# Patient Record
Sex: Male | Born: 1970 | Race: Black or African American | Hispanic: No | Marital: Single | Smoking: Never smoker
Health system: Southern US, Community
[De-identification: ages and names within clinical notes are randomized; demographics above are authoritative.]

## PROBLEM LIST (undated history)

## (undated) DIAGNOSIS — T7840XA Allergy, unspecified, initial encounter: Secondary | ICD-10-CM

## (undated) DIAGNOSIS — I10 Essential (primary) hypertension: Secondary | ICD-10-CM

## (undated) DIAGNOSIS — J45909 Unspecified asthma, uncomplicated: Secondary | ICD-10-CM

## (undated) HISTORY — PX: VASECTOMY: SHX75

## (undated) HISTORY — PX: TONSILLECTOMY: SUR1361

## (undated) HISTORY — DX: Allergy, unspecified, initial encounter: T78.40XA

---

## 2016-01-30 ENCOUNTER — Observation Stay
Admission: EM | Admit: 2016-01-30 | Discharge: 2016-01-31 | Disposition: A | Discharge status: Home / Self Care | Payer: Managed Care, Other (non HMO) | Attending: Internal Medicine | Admitting: Internal Medicine

## 2016-01-30 ENCOUNTER — Encounter: Payer: Self-pay | Admitting: Emergency Medicine

## 2016-01-30 DIAGNOSIS — F1010 Alcohol abuse, uncomplicated: Secondary | ICD-10-CM

## 2016-01-30 DIAGNOSIS — F10129 Alcohol abuse with intoxication, unspecified: Secondary | ICD-10-CM | POA: Diagnosis not present

## 2016-01-30 DIAGNOSIS — R739 Hyperglycemia, unspecified: Secondary | ICD-10-CM | POA: Diagnosis not present

## 2016-01-30 DIAGNOSIS — I959 Hypotension, unspecified: Secondary | ICD-10-CM

## 2016-01-30 DIAGNOSIS — E876 Hypokalemia: Secondary | ICD-10-CM

## 2016-01-30 DIAGNOSIS — Z79899 Other long term (current) drug therapy: Secondary | ICD-10-CM | POA: Insufficient documentation

## 2016-01-30 DIAGNOSIS — N179 Acute kidney failure, unspecified: Secondary | ICD-10-CM | POA: Diagnosis not present

## 2016-01-30 DIAGNOSIS — R55 Syncope and collapse: Secondary | ICD-10-CM | POA: Diagnosis not present

## 2016-01-30 DIAGNOSIS — J45909 Unspecified asthma, uncomplicated: Secondary | ICD-10-CM | POA: Diagnosis not present

## 2016-01-30 DIAGNOSIS — E860 Dehydration: Secondary | ICD-10-CM

## 2016-01-30 DIAGNOSIS — Y908 Blood alcohol level of 240 mg/100 ml or more: Secondary | ICD-10-CM | POA: Diagnosis not present

## 2016-01-30 DIAGNOSIS — R404 Transient alteration of awareness: Secondary | ICD-10-CM

## 2016-01-30 DIAGNOSIS — I10 Essential (primary) hypertension: Secondary | ICD-10-CM | POA: Insufficient documentation

## 2016-01-30 DIAGNOSIS — F10929 Alcohol use, unspecified with intoxication, unspecified: Secondary | ICD-10-CM

## 2016-01-30 DIAGNOSIS — F1210 Cannabis abuse, uncomplicated: Secondary | ICD-10-CM

## 2016-01-30 DIAGNOSIS — I6522 Occlusion and stenosis of left carotid artery: Secondary | ICD-10-CM | POA: Diagnosis not present

## 2016-01-30 DIAGNOSIS — R4189 Other symptoms and signs involving cognitive functions and awareness: Secondary | ICD-10-CM

## 2016-01-30 DIAGNOSIS — I6529 Occlusion and stenosis of unspecified carotid artery: Secondary | ICD-10-CM

## 2016-01-30 HISTORY — DX: Essential (primary) hypertension: I10

## 2016-01-30 HISTORY — DX: Unspecified asthma, uncomplicated: J45.909

## 2016-01-30 LAB — URINALYSIS COMPLETE WITH MICROSCOPIC (ARMC ONLY)
BILIRUBIN URINE: NEGATIVE
Glucose, UA: 50 mg/dL — AB
HGB URINE DIPSTICK: NEGATIVE
KETONES UR: NEGATIVE mg/dL
LEUKOCYTES UA: NEGATIVE
Nitrite: NEGATIVE
PH: 6 (ref 5.0–8.0)
PROTEIN: NEGATIVE mg/dL
RBC / HPF: NONE SEEN RBC/hpf (ref 0–5)
Specific Gravity, Urine: 1.009 (ref 1.005–1.030)
Squamous Epithelial / LPF: NONE SEEN

## 2016-01-30 LAB — BASIC METABOLIC PANEL
ANION GAP: 12 (ref 5–15)
BUN: 26 mg/dL — ABNORMAL HIGH (ref 6–20)
CALCIUM: 8.7 mg/dL — AB (ref 8.9–10.3)
CHLORIDE: 99 mmol/L — AB (ref 101–111)
CO2: 24 mmol/L (ref 22–32)
CREATININE: 1.97 mg/dL — AB (ref 0.61–1.24)
GFR calc non Af Amer: 39 mL/min — ABNORMAL LOW (ref >60)
GFR, EST AFRICAN AMERICAN: 45 mL/min — AB (ref >60)
Glucose, Bld: 172 mg/dL — ABNORMAL HIGH (ref 65–99)
Potassium: 3.2 mmol/L — ABNORMAL LOW (ref 3.5–5.1)
SODIUM: 135 mmol/L (ref 135–145)

## 2016-01-30 LAB — CBC
HCT: 42.9 % (ref 40.0–52.0)
HEMOGLOBIN: 14.2 g/dL (ref 13.0–18.0)
MCH: 29.3 pg (ref 26.0–34.0)
MCHC: 33.1 g/dL (ref 32.0–36.0)
MCV: 88.4 fL (ref 80.0–100.0)
PLATELETS: 280 10*3/uL (ref 150–440)
RBC: 4.85 MIL/uL (ref 4.40–5.90)
RDW: 13.7 % (ref 11.5–14.5)
WBC: 11.5 10*3/uL — AB (ref 3.8–10.6)

## 2016-01-30 MED ORDER — SODIUM CHLORIDE 0.9 % IV BOLUS (SEPSIS)
1000.0000 mL | Freq: Once | INTRAVENOUS | Status: AC
  Administered 2016-01-30: 1000 mL via INTRAVENOUS

## 2016-01-30 MED ORDER — SODIUM CHLORIDE 0.9 % IV BOLUS (SEPSIS)
500.0000 mL | Freq: Once | INTRAVENOUS | Status: AC
  Administered 2016-01-30: 500 mL via INTRAVENOUS

## 2016-01-30 NOTE — ED Provider Notes (Signed)
[facility] Emergency Department Provider Note  ____________________________________________   First MD Initiated Contact with Patient 01/30/16 2208     (approximate)  I have reviewed the triage vital signs and the nursing notes.   HISTORY  Chief Complaint Loss of Consciousness    HPI Abdulwahab Demelo is a 45 y.o. male with a history of hypertension on lisinopril who presents for evaluation after an episode of syncope.  He reports that he was at a family reunion tonight and that all of a sudden he just passed out.  I witness reports state that he may been unconscious for as much as 10 minutes but this is impossible to verify.  He was hypotensive in the field and continued to be hypotensive upon arrival to the emergency department with a systolic blood pressure in the 80s.  He reports that he has been eating and drinking normally lately and has felt in his normal state of health.  He denies fever/chills, chest pain, shortness of breath, nausea, vomiting, diarrhea, dysuria.  He does report that his lisinopril was recently increased from 10-20 mg daily but he states that typically any time he checked his blood pressure it is usually quite elevated and that the low blood pressure today is very unusual.  He reports that he has had a couple of beers over the course of the day today but he does not appear clinically intoxicated at this time.  He also reports that he smokes marijuana earlier in the day.  He currently is in no pain.  The onset of syncope was acute in the symptoms are severe but he appears to be back at his baseline at this time.     Past Medical History:  Diagnosis Date  . Asthma   . Hypertension     There are no active problems to display for this patient.   History reviewed. No pertinent surgical history.  Prior to Admission medications   Medication Sig Start Date End Date Taking? Authorizing Provider  lisinopril-hydrochlorothiazide  (PRINZIDE,ZESTORETIC) 20-25 MG tablet Take 1 tablet by mouth daily. 12/14/15  Yes Historical Provider, MD  PROAIR HFA 108 (90 Base) MCG/ACT inhaler Inhale 2 puffs into the lungs every 4 (four) hours as needed. 12/18/15  Yes Historical Provider, MD  VIAGRA 100 MG tablet Take 1 tablet by mouth as needed. 12/04/15  Yes Historical Provider, MD    Allergies Review of patient's allergies indicates no known allergies.  History reviewed. No pertinent family history.  Social History Social History  Substance Use Topics  . Smoking status: Never Smoker  . Smokeless tobacco: Never Used  . Alcohol use Yes    Review of Systems Constitutional: No fever/chills Eyes: No visual changes. ENT: No sore throat. Cardiovascular: Denies chest pain.  Syncope and collapse. Respiratory: Denies shortness of breath. Gastrointestinal: No abdominal pain.  No nausea, no vomiting.  No diarrhea.  No constipation. Genitourinary: Negative for dysuria. Musculoskeletal: Negative for back pain. Skin: Negative for rash. Neurological: Negative for headaches, focal weakness or numbness.    10-point ROS otherwise negative.  ____________________________________________   PHYSICAL EXAM:  VITAL SIGNS: ED Triage Vitals  Enc Vitals Group     BP 01/30/16 2200 98/60     Pulse Rate 01/30/16 2200 93     Resp 01/30/16 2200 12     Temp 01/30/16 2200 97.6 F (36.4 C)     Temp Source 01/30/16 2200 Oral     SpO2 01/30/16 2200 100 %     Weight  01/30/16 2201 160 lb (72.6 kg)     Height 01/30/16 2201 5\' 9"  (1.753 m)     Head Circumference --      Peak Flow --      Pain Score 01/30/16 2201 0     Pain Loc --      Pain Edu? --      Excl. in GC? --     Constitutional: Alert and oriented. Well appearing and in no acute distress. Eyes: Conjunctivae are normal. PERRL. EOMI. Head: Atraumatic. Nose: No congestion/rhinnorhea. Mouth/Throat: Mucous membranes are moist.  Oropharynx non-erythematous. Neck: No stridor.  No meningeal  signs.  No cervical spine tenderness to palpation. Cardiovascular: Normal rate, regular rhythm. Good peripheral circulation. Grossly normal heart sounds. Respiratory: Normal respiratory effort.  No retractions. Lungs CTAB. Gastrointestinal: Soft and nontender. No distention.  Musculoskeletal: No lower extremity tenderness nor edema. No gross deformities of extremities. Neurologic:  Normal speech and language. No gross focal neurologic deficits are appreciated.  Skin:  Skin is warm, dry and intact. No rash noted. Psychiatric: Mood and affect are normal. Speech and behavior are normal.  ____________________________________________   LABS (all labs ordered are listed, but only abnormal results are displayed)  Labs Reviewed  BASIC METABOLIC PANEL - Abnormal; Notable for the following:       Result Value   Potassium 3.2 (*)    Chloride 99 (*)    Glucose, Bld 172 (*)    BUN 26 (*)    Creatinine, Ser 1.97 (*)    Calcium 8.7 (*)    GFR calc non Af Amer 39 (*)    GFR calc Af Amer 45 (*)    All other components within normal limits  CBC - Abnormal; Notable for the following:    WBC 11.5 (*)    All other components within normal limits  URINALYSIS COMPLETEWITH MICROSCOPIC ([facility] ONLY) - Abnormal; Notable for the following:    Color, Urine STRAW (*)    APPearance CLEAR (*)    Glucose, UA 50 (*)    Bacteria, UA RARE (*)    All other components within normal limits  HEPATIC FUNCTION PANEL - Abnormal; Notable for the following:    Total Protein 6.3 (*)    All other components within normal limits  LIPASE, BLOOD  CK  TROPONIN I  ETHANOL  URINE DRUG SCREEN, QUALITATIVE ([facility] ONLY)  CBG MONITORING, ED   ____________________________________________  EKG  ED ECG REPORT I, Anil Havard, the attending physician, personally viewed and interpreted this ECG.  Date: 01/30/2016 EKG Time: 22:03 Rate: 92 Rhythm: normal sinus rhythm QRS Axis: normal Intervals: normal ST/T Wave  abnormalities: normal Conduction Disturbances: none Narrative Interpretation: unremarkable  ____________________________________________  RADIOLOGY   No results found.  ____________________________________________   PROCEDURES  Procedure(s) performed:   Procedures   Critical Care performed: No ____________________________________________   INITIAL IMPRESSION / ASSESSMENT AND PLAN / ED COURSE  Pertinent labs & imaging results that were available during my care of the patient were reviewed by me and considered in my medical decision making (see chart for details).  The patient appears to have acute kidney injury/acute renal failure, possibly due to the recent increase in his lisinopril.  He is persistently hypotensive although he is alert and oriented 3.  There is no indication of sepsis or other emergent medical condition other than the acute renal failure.  I believe that he needs to be observed to make sure that his kidneys are improving with aggressive hydration and he  may require renal consult if he has not.  Additionally he will need a change in management of his hypertension if his creatinine is not improved.  I will admit him to the hospitalist for further management.  ____________________________________________  FINAL CLINICAL IMPRESSION(S) / ED DIAGNOSES  Final diagnoses:  Acute renal failure, unspecified acute renal failure type (HCC)  Syncope and collapse  Hypotension, unspecified hypotension type     MEDICATIONS GIVEN DURING THIS VISIT:  Medications  sodium chloride 0.9 % bolus 500 mL (0 mLs Intravenous Stopped 01/30/16 2353)  sodium chloride 0.9 % bolus 1,000 mL (1,000 mLs Intravenous New Bag/Given 01/30/16 2355)     NEW OUTPATIENT MEDICATIONS STARTED DURING THIS VISIT:  New Prescriptions   No medications on file    Modified Medications   No medications on file    Discontinued Medications   No medications on file     Note:  This document  was prepared using Dragon voice recognition software and may include unintentional dictation errors.    Loleta Rose, MD 01/31/16 [phone]

## 2016-01-30 NOTE — ED Triage Notes (Signed)
Pt arrived via EMS from his [employer]. Pt thinks he was sitting in a chair but next thing he knew he woke up on the floor. Pt's wife reports that pt was unconscious for approx. 10 minutes. When fire department arrived pt had snoring respirations, after a sternal rub pt started to become more alert. EMS got a manual BP of 86/50 and was ST in the low 100s. Pt has hx of HTN, takes lisinopril, two months ago dose was increased from 10mg  to 20mg , but patient reports that he has been tolerating that dose fine. Pt reports that tonight he had only had approximately one sip of beer. Pt reports that he has had some marijuana earlier in the day. Pt denies any pain.

## 2016-01-31 ENCOUNTER — Encounter: Payer: Self-pay | Admitting: Internal Medicine

## 2016-01-31 ENCOUNTER — Observation Stay: Payer: Managed Care, Other (non HMO)

## 2016-01-31 ENCOUNTER — Observation Stay
Admit: 2016-01-31 | Discharge: 2016-01-31 | Disposition: A | Discharge status: Home / Self Care | Payer: Managed Care, Other (non HMO) | Attending: Internal Medicine | Admitting: Internal Medicine

## 2016-01-31 DIAGNOSIS — I959 Hypotension, unspecified: Secondary | ICD-10-CM

## 2016-01-31 DIAGNOSIS — E860 Dehydration: Secondary | ICD-10-CM

## 2016-01-31 DIAGNOSIS — I6529 Occlusion and stenosis of unspecified carotid artery: Secondary | ICD-10-CM

## 2016-01-31 DIAGNOSIS — F1010 Alcohol abuse, uncomplicated: Secondary | ICD-10-CM

## 2016-01-31 DIAGNOSIS — R4189 Other symptoms and signs involving cognitive functions and awareness: Secondary | ICD-10-CM | POA: Diagnosis present

## 2016-01-31 DIAGNOSIS — R739 Hyperglycemia, unspecified: Secondary | ICD-10-CM

## 2016-01-31 DIAGNOSIS — E876 Hypokalemia: Secondary | ICD-10-CM

## 2016-01-31 DIAGNOSIS — F1210 Cannabis abuse, uncomplicated: Secondary | ICD-10-CM

## 2016-01-31 DIAGNOSIS — N179 Acute kidney failure, unspecified: Secondary | ICD-10-CM

## 2016-01-31 LAB — CBC
HCT: 42.1 % (ref 40.0–52.0)
HEMOGLOBIN: 14.1 g/dL (ref 13.0–18.0)
MCH: 29.3 pg (ref 26.0–34.0)
MCHC: 33.5 g/dL (ref 32.0–36.0)
MCV: 87.6 fL (ref 80.0–100.0)
Platelets: 248 10*3/uL (ref 150–440)
RBC: 4.81 MIL/uL (ref 4.40–5.90)
RDW: 13.6 % (ref 11.5–14.5)
WBC: 8.5 10*3/uL (ref 3.8–10.6)

## 2016-01-31 LAB — URINE DRUG SCREEN, QUALITATIVE (ARMC ONLY)
Amphetamines, Ur Screen: NOT DETECTED
BARBITURATES, UR SCREEN: NOT DETECTED
BENZODIAZEPINE, UR SCRN: NOT DETECTED
CANNABINOID 50 NG, UR ~~LOC~~: NOT DETECTED
Cocaine Metabolite,Ur ~~LOC~~: NOT DETECTED
MDMA (Ecstasy)Ur Screen: NOT DETECTED
METHADONE SCREEN, URINE: NOT DETECTED
OPIATE, UR SCREEN: NOT DETECTED
PHENCYCLIDINE (PCP) UR S: NOT DETECTED
Tricyclic, Ur Screen: NOT DETECTED

## 2016-01-31 LAB — CK: CK TOTAL: 212 U/L (ref 49–397)

## 2016-01-31 LAB — LIPASE, BLOOD: LIPASE: 27 U/L (ref 11–51)

## 2016-01-31 LAB — BASIC METABOLIC PANEL
ANION GAP: 7 (ref 5–15)
ANION GAP: 5 (ref 5–15)
BUN: 21 mg/dL — ABNORMAL HIGH (ref 6–20)
BUN: 18 mg/dL (ref 6–20)
CALCIUM: 8.4 mg/dL — AB (ref 8.9–10.3)
CHLORIDE: 102 mmol/L (ref 101–111)
CHLORIDE: 103 mmol/L (ref 101–111)
CO2: 27 mmol/L (ref 22–32)
CO2: 27 mmol/L (ref 22–32)
Calcium: 8.6 mg/dL — ABNORMAL LOW (ref 8.9–10.3)
Creatinine, Ser: 1.36 mg/dL — ABNORMAL HIGH (ref 0.61–1.24)
Creatinine, Ser: 1.09 mg/dL (ref 0.61–1.24)
GFR calc Af Amer: >60 mL/min (ref >60)
GFR calc non Af Amer: >60 mL/min (ref >60)
GFR calc non Af Amer: >60 mL/min (ref >60)
Glucose, Bld: 170 mg/dL — ABNORMAL HIGH (ref 65–99)
Glucose, Bld: 107 mg/dL — ABNORMAL HIGH (ref 65–99)
POTASSIUM: 3.9 mmol/L (ref 3.5–5.1)
Potassium: 4.3 mmol/L (ref 3.5–5.1)
SODIUM: 135 mmol/L (ref 135–145)
Sodium: 136 mmol/L (ref 135–145)

## 2016-01-31 LAB — HEPATIC FUNCTION PANEL
ALT: 28 U/L (ref 17–63)
AST: 34 U/L (ref 15–41)
Albumin: 3.8 g/dL (ref 3.5–5.0)
Alkaline Phosphatase: 39 U/L (ref 38–126)
BILIRUBIN DIRECT: 0.1 mg/dL (ref 0.1–0.5)
BILIRUBIN INDIRECT: 0.6 mg/dL (ref 0.3–0.9)
Total Bilirubin: 0.7 mg/dL (ref 0.3–1.2)
Total Protein: 6.3 g/dL — ABNORMAL LOW (ref 6.5–8.1)

## 2016-01-31 LAB — TROPONIN I
Troponin I: <0.03 ng/mL (ref <0.03)
Troponin I: <0.03 ng/mL (ref <0.03)
Troponin I: <0.03 ng/mL (ref <0.03)

## 2016-01-31 LAB — ECHOCARDIOGRAM COMPLETE
Height: 69 in
Weight: 2542.4 oz

## 2016-01-31 LAB — ETHANOL: Alcohol, Ethyl (B): 275 mg/dL — ABNORMAL HIGH (ref <5)

## 2016-01-31 MED ORDER — FOLIC ACID 1 MG PO TABS
1.0000 mg | ORAL_TABLET | Freq: Every day | ORAL | Status: DC
  Administered 2016-01-31: 1 mg via ORAL
  Filled 2016-01-31: qty 1

## 2016-01-31 MED ORDER — ONDANSETRON HCL 4 MG PO TABS: 4.0000 mg | ORAL_TABLET | Freq: Four times a day (QID) | ORAL | Status: DC | PRN

## 2016-01-31 MED ORDER — ACETAMINOPHEN 325 MG PO TABS: 650.0000 mg | ORAL_TABLET | Freq: Four times a day (QID) | ORAL | Status: DC | PRN

## 2016-01-31 MED ORDER — SODIUM CHLORIDE 0.9% FLUSH
3.0000 mL | Freq: Two times a day (BID) | INTRAVENOUS | Status: DC
  Administered 2016-01-31: 3 mL via INTRAVENOUS

## 2016-01-31 MED ORDER — SENNOSIDES-DOCUSATE SODIUM 8.6-50 MG PO TABS: 1.0000 | ORAL_TABLET | Freq: Every evening | ORAL | Status: DC | PRN

## 2016-01-31 MED ORDER — POTASSIUM CHLORIDE CRYS ER 20 MEQ PO TBCR
40.0000 meq | EXTENDED_RELEASE_TABLET | Freq: Once | ORAL | Status: AC
  Administered 2016-01-31: 40 meq via ORAL
  Filled 2016-01-31: qty 2

## 2016-01-31 MED ORDER — LORAZEPAM 1 MG PO TABS: 1.0000 mg | ORAL_TABLET | Freq: Four times a day (QID) | ORAL | Status: DC | PRN

## 2016-01-31 MED ORDER — LORAZEPAM 2 MG PO TABS: 0.0000 mg | ORAL_TABLET | Freq: Four times a day (QID) | ORAL | Status: DC

## 2016-01-31 MED ORDER — LORAZEPAM 2 MG/ML IJ SOLN: 1.0000 mg | Freq: Four times a day (QID) | INTRAMUSCULAR | Status: DC | PRN

## 2016-01-31 MED ORDER — SODIUM CHLORIDE 0.9 % IV SOLN
INTRAVENOUS | Status: DC
  Administered 2016-01-31 (×2): via INTRAVENOUS

## 2016-01-31 MED ORDER — THIAMINE HCL 100 MG PO TABS: 100.0000 mg | ORAL_TABLET | Freq: Every day | ORAL | 3 refills | Status: DC

## 2016-01-31 MED ORDER — ALBUTEROL SULFATE (2.5 MG/3ML) 0.083% IN NEBU: 3.0000 mL | INHALATION_SOLUTION | RESPIRATORY_TRACT | Status: DC | PRN

## 2016-01-31 MED ORDER — VITAMIN B-1 100 MG PO TABS
100.0000 mg | ORAL_TABLET | Freq: Every day | ORAL | Status: DC
  Administered 2016-01-31: 100 mg via ORAL
  Filled 2016-01-31: qty 1

## 2016-01-31 MED ORDER — THIAMINE HCL 100 MG/ML IJ SOLN: 100.0000 mg | Freq: Every day | INTRAMUSCULAR | Status: DC

## 2016-01-31 MED ORDER — ACETAMINOPHEN 650 MG RE SUPP: 650.0000 mg | Freq: Four times a day (QID) | RECTAL | Status: DC | PRN

## 2016-01-31 MED ORDER — HEPARIN SODIUM (PORCINE) 5000 UNIT/ML IJ SOLN
5000.0000 [IU] | Freq: Three times a day (TID) | INTRAMUSCULAR | Status: DC
  Administered 2016-01-31 (×2): 5000 [IU] via SUBCUTANEOUS
  Filled 2016-01-31 (×2): qty 1

## 2016-01-31 MED ORDER — FOLIC ACID 1 MG PO TABS: 1.0000 mg | ORAL_TABLET | Freq: Every day | ORAL | 3 refills | Status: DC

## 2016-01-31 MED ORDER — INFLUENZA VAC SPLIT QUAD 0.5 ML IM SUSY: 0.5000 mL | PREFILLED_SYRINGE | INTRAMUSCULAR | Status: DC

## 2016-01-31 MED ORDER — LORAZEPAM 2 MG PO TABS: 0.0000 mg | ORAL_TABLET | Freq: Two times a day (BID) | ORAL | Status: DC

## 2016-01-31 MED ORDER — ADULT MULTIVITAMIN W/MINERALS CH
1.0000 | ORAL_TABLET | Freq: Every day | ORAL | Status: DC
  Administered 2016-01-31: 1 via ORAL
  Filled 2016-01-31: qty 1

## 2016-01-31 MED ORDER — ONDANSETRON HCL 4 MG/2ML IJ SOLN: 4.0000 mg | Freq: Four times a day (QID) | INTRAMUSCULAR | Status: DC | PRN

## 2016-01-31 NOTE — Discharge Instructions (Signed)

## 2016-01-31 NOTE — H&P (Addendum)
[facility] [place] [facility]   PATIENT NAME: Ruben Gonzalez Gonzalez    MR#:  [id number]  DATE OF BIRTH:  01/12/1971  DATE OF ADMISSION:  01/30/2016  PRIMARY CARE PHYSICIAN: Pcp Not In System   REQUESTING/REFERRING PHYSICIAN:   CHIEF COMPLAINT:   Chief Complaint  Patient presents with  . Loss of Consciousness    HISTORY OF PRESENT ILLNESS: Ruben Gonzalez  is a 45 y.o. male with a known history of Hypertension, bronchial asthma presented to the emergency room after he passed out yesterday around 9 PM. 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 No history of any seizure. No history of head injury. Patient in the morning also had 2 beers, no complaints of any chest pain. Patient had similar episode 2 years ago when he passed out but he says it has been secondary to his BP medication. Patient never had any recent cardiac workup done. A set of troponin was negative telemetry monitoring uneventful. No complaints of shortness of breath, orthopnea or proximal nocturnal dyspnea. Blood alcohol level was high. Patient had ow blood pressure when he arrived in ER.EKG normal sinus rhythm withST segment changes.  PAST MEDICAL HISTORY:   Past Medical History:  Diagnosis Date  . Asthma   . Hypertension     PAST SURGICAL HISTORY: Past Surgical History:  Procedure Laterality Date  . VASECTOMY      SOCIAL HISTORY:  Social History  Substance Use Topics  . Smoking status: Never Smoker  . Smokeless tobacco: Never Used  . Alcohol use Yes    FAMILY HISTORY:  Family History  Problem Relation Age of Onset  . Heart attack Mother     DRUG ALLERGIES: No Known Allergies  REVIEW OF SYSTEMS:   CONSTITUTIONAL: No fever, has weakness.  EYES: No blurred or double vision.  EARS, NOSE, AND THROAT: No tinnitus or ear pain.  RESPIRATORY: No cough, shortness of breath, wheezing or hemoptysis.  CARDIOVASCULAR: No  chest pain, orthopnea, edema.  GASTROINTESTINAL: No nausea, vomiting, diarrhea or abdominal pain.  GENITOURINARY: No dysuria, hematuria.  ENDOCRINE: No polyuria, nocturia,  HEMATOLOGY: No anemia, easy bruising or bleeding SKIN: No rash or lesion. MUSCULOSKELETAL: No joint pain or arthritis.   NEUROLOGIC: No tingling, numbness, weakness.  PSYCHIATRY: No anxiety or depression.   MEDICATIONS AT HOME:  Prior to Admission medications   Medication Sig Start Date End Date Taking? Authorizing Provider  lisinopril-hydrochlorothiazide (PRINZIDE,ZESTORETIC) 20-25 MG tablet Take 1 tablet by mouth daily. 12/14/15  Yes Historical Provider, MD  PROAIR HFA 108 (90 Base) MCG/ACT inhaler Inhale 2 puffs into the lungs every 4 (four) hours as needed. 12/18/15  Yes Historical Provider, MD  VIAGRA 100 MG tablet Take 1 tablet by mouth as needed. 12/04/15  Yes Historical Provider, MD      PHYSICAL EXAMINATION:   VITAL SIGNS: Blood pressure 112/84, pulse (!) 101, temperature 97.6 F (36.4 C), temperature source Oral, resp. rate 16, height 5\' 9"  (1.753 m), weight 72.6 kg (160 lb), SpO2 96 %.  GENERAL:  45 y.o.-year-old well built male patient lying in the bed with no acute distress.  EYES: Pupils equal, round, reactive to light and accommodation. No scleral icterus. Extraocular muscles intact.  HEENT: Head atraumatic, normocephalic. Oropharynx and nasopharynx clear.  NECK:  Supple, no jugular venous distention. No thyroid enlargement, no tenderness.  LUNGS: Normal breath sounds bilaterally, no wheezing, rales,rhonchi or crepitation. No use of accessory muscles  of respiration.  CARDIOVASCULAR: S1, S2 normal. No murmurs, rubs, or gallops.  ABDOMEN: Soft, nontender, nondistended. Bowel sounds present. No organomegaly or mass.  EXTREMITIES: No pedal edema, cyanosis, or clubbing.  NEUROLOGIC: Cranial nerves II through XII are intact. Muscle strength 5/5 in all extremities. Sensation intact. Gait not checked.   PSYCHIATRIC: The patient is alert and oriented x 3.  SKIN: No obvious rash, lesion, or ulcer.   LABORATORY PANEL:   CBC  Recent Labs Lab 01/30/16 2205  WBC 11.5*  HGB 14.2  HCT 42.9  PLT 280  MCV 88.4  MCH 29.3  MCHC 33.1  RDW 13.7   ------------------------------------------------------------------------------------------------------------------  Chemistries   Recent Labs Lab 01/29/16 2205 01/30/16 2205  NA  --  135  K  --  3.2*  CL  --  99*  CO2  --  24  GLUCOSE  --  172*  BUN  --  26*  CREATININE  --  1.97*  CALCIUM  --  8.7*  AST 34  --   ALT 28  --   ALKPHOS 39  --   BILITOT 0.7  --    ------------------------------------------------------------------------------------------------------------------ estimated creatinine clearance is 46.9 mL/min (by C-G formula based on SCr of 1.97 mg/dL (H)). ------------------------------------------------------------------------------------------------------------------ No results for input(s): TSH, T4TOTAL, T3FREE, THYROIDAB in the last 72 hours.  Invalid input(s): FREET3   Coagulation profile No results for input(s): INR, PROTIME in the last 168 hours. ------------------------------------------------------------------------------------------------------------------- No results for input(s): DDIMER in the last 72 hours. -------------------------------------------------------------------------------------------------------------------  Cardiac Enzymes  Recent Labs Lab 01/29/16 2205  TROPONINI <0.03   ------------------------------------------------------------------------------------------------------------------ Invalid input(s): POCBNP  ---------------------------------------------------------------------------------------------------------------  Urinalysis    Component Value Date/Time   COLORURINE STRAW (A) 01/30/2016 2349   APPEARANCEUR CLEAR (A) 01/30/2016 2349   LABSPEC 1.009 01/30/2016 2349    PHURINE 6.0 01/30/2016 2349   GLUCOSEU 50 (A) 01/30/2016 2349   HGBUR NEGATIVE 01/30/2016 2349   BILIRUBINUR NEGATIVE 01/30/2016 2349   KETONESUR NEGATIVE 01/30/2016 2349   PROTEINUR NEGATIVE 01/30/2016 2349   NITRITE NEGATIVE 01/30/2016 2349   LEUKOCYTESUR NEGATIVE 01/30/2016 2349     RADIOLOGY: No results found.  EKG: Orders placed or performed during the hospital encounter of 01/30/16  . ED EKG  . ED EKG  . EKG 12-Lead  . EKG 12-Lead    IMPRESSION AND PLAN: 45 year old male patient with history of bronchial asthma, hypertension presented to the emergency room after he passed out. Admitting diagnosis 1. Syncope secondary to vasovagal etiology 2. Hypokalemia 3. Dehydration 4. Acute renal insufficiency 5. Hypertension 6. Bronchial asthma 7. Alcohol intoxication Treatment plan Admit patient to telemetry observation bed Cycle troponin to rule out ischemia Replace potassium IV fluid hydration Hold diuretics ciwa protocol. Check echocardiogram and carotid ultrasound DVT prophylaxis with subcutaneous heparin Supportive care.  All the records are reviewed and case discussed with ED provider. Management plans discussed with the patient, family and they are in agreement.  CODE STATUS:FULL Code Status History    This patient does not have a recorded code status. Please follow your organizational policy for patients in this situation.       TOTAL TIME TAKING CARE OF THIS PATIENT: 50 minutes.    Ihor Austin M.D on 01/31/2016 at 2:47 AM  Between 7am to 6pm - Pager - [phone]  After 6pm go to [email or web address] - password [email or web address]  [facility]  Office  [phone]  CC: Primary care physician; Pcp Not In System

## 2016-01-31 NOTE — Discharge Summary (Signed)
[facility] [place] at [facility]   PATIENT NAME: Ruben Gonzalez    MR#:  [id number]  DATE OF BIRTH:  21-Jun-1970  DATE OF ADMISSION:  01/30/2016 ADMITTING PHYSICIAN: Ihor Austin, MD  DATE OF DISCHARGE: No discharge date for patient encounter.  PRIMARY CARE PHYSICIAN: Pcp Not In System     ADMISSION DIAGNOSIS:  Syncope and collapse [R55] Syncope [R55] Alcohol intoxication, with unspecified complication (HCC) [F10.129] Hypotension, unspecified hypotension type [I95.9] Acute renal failure, unspecified acute renal failure type (HCC) [N17.9]  DISCHARGE DIAGNOSIS:  Principal Problem:   Unresponsive episode Active Problems:   Acute renal failure (HCC)   Hyperglycemia   Carotid artery stenosis   Hypotension   Dehydration   Hypokalemia   Alcohol abuse   Marijuana abuse   SECONDARY DIAGNOSIS:   Past Medical History:  Diagnosis Date  . Asthma   . Hypertension     .pro HOSPITAL COURSE:   The patient is 45 year old african [place] male with PMH of alcohol, marijuana abuse, essential hypertension, asthma, who presents to the hospital with prolonged episode of unresponsiveness, lasting about 10 minutes and presenting as blank stare. On arrival to the hospital he was hypotensive, was found to have acute renal failure. Syncope work up was initiated, the patient was rehydrated and kidney function improved. Urinalysis was unremarkable, UDS was negative, despite patient admitting of using marijuana recently. Alcohol level was 275 on arrival. Radiologic studies : carotid US showed minimal amount of left-sided atherosclerotic plaque resulting in elevated peak systolic velocities with the left internal carotid artery - while potentially artifactual due to sampling at a location of turbulent flow, elevated peak systolic velocity is compatible with the lower end of the 50 to 69% luminal narrowing range. Further evaluation with CTA was recommended as clinically  indicated, minimal amount of right-sided atherosclerotic plaque, not resulting in a hemodynamically significant stenosis. Brain MRI was normal, but fluid throughout the left middle ear and mastoids suggesting otitis media, the patient was asymptomatic, so no medical therapy was initiated at this time. Echo showed mild concentric hypertrophy, normal EF, grade 1 diastolic dysfunction. The patient is to be seen by neurologist in the hospital and was recommended outpatient EEG and possibly cardiac stress test to rule out any underlying mechanisms of syncope/unresponsiveness.  Discussion by problem: 1. Unresponsive episode, ? etiology at this time, possibly due to hypotension, dehydration, all work up was unremarkable except for mild left carotid artery stenosis, but since the ;aptient had similar presentation in the past, cardiac stress test and EEG was recommended as outpatient and no driving until work up is completed. 2. Acute renal failure, improved with IV fluids, follow as outpatient. 3.hypotension, resolved on IVF, likley dehydration and BP meds related, holding blood pressure medications, follow up with PCP and restart as outpatient 4. Hypokalemia, resolved 5 alcohol , marijuana abuse, no signs of withdrawal, CIWA scale is 0 6 CAS,  minimal, may benefit from vascular surgery follow up if all studies are negative.  DISCHARGE CONDITIONS:   stable  CONSULTS OBTAINED:  Treatment Team:  Pauletta Browns, MD  DRUG ALLERGIES:  No Known Allergies  DISCHARGE MEDICATIONS:   Current Discharge Medication List    START taking these medications   Details  folic acid (FOLVITE) 1 MG tablet Take 1 tablet (1 mg total) by mouth daily. Qty: 30 tablet, Refills: 3    thiamine 100 MG tablet Take 1 tablet (100 mg total) by mouth daily. Qty: 30 tablet, Refills: 3  CONTINUE these medications which have NOT CHANGED   Details  PROAIR HFA 108 (90 Base) MCG/ACT inhaler Inhale 2 puffs into the lungs every  4 (four) hours as needed.    VIAGRA 100 MG tablet Take 1 tablet by mouth as needed.      STOP taking these medications     lisinopril-hydrochlorothiazide (PRINZIDE,ZESTORETIC) 20-25 MG tablet          DISCHARGE INSTRUCTIONS:    The patient is to follow up with cardiologist, neurologist as outpatient  If you experience worsening of your admission symptoms, develop shortness of breath, life threatening emergency, suicidal or homicidal thoughts you must seek medical attention immediately by calling 911 or calling your MD immediately  if symptoms less severe.  You Must read complete instructions/literature along with all the possible adverse reactions/side effects for all the Medicines you take and that have been prescribed to you. Take any new Medicines after you have completely understood and accept all the possible adverse reactions/side effects.   Please note  You were cared for by a hospitalist during your hospital stay. If you have any questions about your discharge medications or the care you received while you were in the hospital after you are discharged, you can call the unit and asked to speak with the hospitalist on call if the hospitalist that took care of you is not available. Once you are discharged, your primary care physician will handle any further medical issues. Please note that NO REFILLS for any discharge medications will be authorized once you are discharged, as it is imperative that you return to your primary care physician (or establish a relationship with a primary care physician if you do not have one) for your aftercare needs so that they can reassess your need for medications and monitor your lab values.    Today   CHIEF COMPLAINT:   Chief Complaint  Patient presents with  . Loss of Consciousness    HISTORY OF PRESENT ILLNESS:  Ruben Gonzalez  is a 45 y.o. male with a known history of alcohol, marijuana abuse, essential hypertension, asthma, who  presents to the hospital with prolonged episode of unresponsiveness, lasting about 10 minutes and presenting as blank stare. On arrival to the hospital he was hypotensive, was found to have acute renal failure. Syncope work up was initiated, the patient was rehydrated and kidney function improved. Urinalysis was unremarkable, UDS was negative, despite patient admitting of using marijuana recently. Alcohol level was 275 on arrival. Radiologic studies : carotid US showed minimal amount of left-sided atherosclerotic plaque resulting in elevated peak systolic velocities with the left internal carotid artery - while potentially artifactual due to sampling at a location of turbulent flow, elevated peak systolic velocity is compatible with the lower end of the 50 to 69% luminal narrowing range. Further evaluation with CTA was recommended as clinically indicated, minimal amount of right-sided atherosclerotic plaque, not resulting in a hemodynamically significant stenosis. Brain MRI was normal, but fluid throughout the left middle ear and mastoids suggesting otitis media, the patient was asymptomatic, so no medical therapy was initiated at this time. Echo showed mild concentric hypertrophy, normal EF, grade 1 diastolic dysfunction. The patient is to be seen by neurologist in the hospital and was recommended outpatient EEG and possibly cardiac stress test to rule out any underlying mechanisms of syncope/unresponsiveness.  Discussion by problem: 1. Unresponsive episode, ? etiology at this time, possibly due to hypotension, dehydration, all work up was unremarkable except for mild left carotid artery  stenosis, but since the ;aptient had similar presentation in the past, cardiac stress test and EEG was recommended as outpatient and no driving until work up is completed. 2. Acute renal failure, improved with IV fluids, follow as outpatient. 3.hypotension, resolved on IVF, likley dehydration and BP meds related, holding blood  pressure medications, follow up with PCP and restart as outpatient 4. Hypokalemia, resolved 5 alcohol , marijuana abuse, no signs of withdrawal, CIWA scale is 0 6 CAS,  minimal, may benefit from vascular surgery follow up if all studies are negative.    VITAL SIGNS:  Blood pressure 125/85, pulse 97, temperature 99.8 F (37.7 C), temperature source Oral, resp. rate (!) 21, height 5\' 9"  (1.753 m), weight 72.1 kg (158 lb 14.4 oz), SpO2 97 %.  I/O:   Intake/Output Summary (Last 24 hours) at 01/31/16 1446 Last data filed at 01/31/16 1300  Gross per 24 hour  Intake          2023.33 ml  Output              750 ml  Net          1273.33 ml    PHYSICAL EXAMINATION:  GENERAL:  46 y.o.-year-old patient lying in the bed with no acute distress.  EYES: Pupils equal, round, reactive to light and accommodation. No scleral icterus. Extraocular muscles intact.  HEENT: Head atraumatic, normocephalic. Oropharynx and nasopharynx clear.  NECK:  Supple, no jugular venous distention. No thyroid enlargement, no tenderness.  LUNGS: Normal breath sounds bilaterally, no wheezing, rales,rhonchi or crepitation. No use of accessory muscles of respiration.  CARDIOVASCULAR: S1, S2 normal. No murmurs, rubs, or gallops.  ABDOMEN: Soft, non-tender, non-distended. Bowel sounds present. No organomegaly or mass.  EXTREMITIES: No pedal edema, cyanosis, or clubbing.  NEUROLOGIC: Cranial nerves II through XII are intact. Muscle strength 5/5 in all extremities. Sensation intact. Gait not checked.  PSYCHIATRIC: The patient is alert and oriented x 3.  SKIN: No obvious rash, lesion, or ulcer.   DATA REVIEW:   CBC  Recent Labs Lab 01/31/16 0354  WBC 8.5  HGB 14.1  HCT 42.1  PLT 248    Chemistries   Recent Labs Lab 01/29/16 2205  01/31/16 0354  NA  --   < > 136  K  --   < > 4.3  CL  --   < > 102  CO2  --   < > 27  GLUCOSE  --   < > 170*  BUN  --   < > 21*  CREATININE  --   < > 1.36*  CALCIUM  --   < > 8.4*   AST 34  --   --   ALT 28  --   --   ALKPHOS 39  --   --   BILITOT 0.7  --   --   < > = values in this interval not displayed.  Cardiac Enzymes  Recent Labs Lab 01/31/16 0957  TROPONINI <0.03    Microbiology Results  No results found for this or any previous visit.  RADIOLOGY:  Mr Brain 63 Contrast  Result Date: 01/31/2016 CLINICAL DATA:  45 year old male with episode of unresponsiveness, syncope. Initial encounter. EXAM: MRI HEAD WITHOUT CONTRAST TECHNIQUE: Multiplanar, multiecho pulse sequences of the brain and surrounding structures were obtained without intravenous contrast. COMPARISON:  Carotid Doppler ultrasound today reported separately. FINDINGS: Brain: No restricted diffusion to suggest acute infarction. No midline shift, mass effect, evidence of mass lesion, ventriculomegaly, extra-axial collection or  acute intracranial hemorrhage. Cervicomedullary junction and pituitary are within normal limits. Wallace CullensGray and white matter signal is within normal limits for age throughout the brain. No cortical encephalomalacia or chronic cerebral blood products. Vascular: Major intracranial vascular flow voids are preserved and appear normal. Skull and upper cervical spine: Negative. Visualized bone marrow signal is within normal limits. Sinuses/Orbits: Normal orbits soft tissues. Mild mostly ethmoid paranasal sinus mucosal thickening. Other: Fluid throughout the left tympanic cavity and mastoid air cells. Diffusion is facilitated. The left EAC appears to remain clear. Negative nasopharynx aside from trace retained secretions. The right middle ear and mastoids are clear. The other Visible internal auditory structures appear normal. Negative scalp soft tissues. IMPRESSION: 1. Normal for age noncontrast MRI appearance of the brain. 2. Fluid throughout the left middle ear and mastoids suggesting otitis media. Cholesteatoma might also have this appearance. Electronically Signed   By: Odessa FlemingH  Hall M.D.   On:  01/31/2016 13:05   [place] Carotid Bilateral  Result Date: 01/31/2016 CLINICAL DATA:  Syncopal episode.  History of hypertension. EXAM: BILATERAL CAROTID DUPLEX ULTRASOUND TECHNIQUE: Wallace CullensGray scale imaging, color Doppler and duplex ultrasound were performed of bilateral carotid and vertebral arteries in the neck. COMPARISON:  None. FINDINGS: Criteria: Quantification of carotid stenosis is based on velocity parameters that correlate the residual internal carotid diameter with NASCET-based stenosis levels, using the diameter of the distal internal carotid lumen as the denominator for stenosis measurement. The following velocity measurements were obtained: RIGHT ICA:  84/36 cm/sec CCA:  122/36 cm/sec SYSTOLIC ICA/CCA RATIO:  0.7 DIASTOLIC ICA/CCA RATIO:  1.0 ECA:  132 cm/sec LEFT ICA:  141/33 cm/sec CCA:  125/41 cm/sec SYSTOLIC ICA/CCA RATIO:  1.1 DIASTOLIC ICA/CCA RATIO:  0.8 ECA:  110 cm/sec RIGHT CAROTID ARTERY: There is a very minimal amount of the center mixed echogenic plaque within the right carotid bulb (images 14 and 15), extending to involve the origin and proximal aspects of the right internal carotid artery (image 23), not resulting in elevated peak systolic velocities within the interrogated course the right internal carotid artery to suggest a hemodynamically significant stenosis. RIGHT VERTEBRAL ARTERY:  Antegrade Flow LEFT CAROTID ARTERY: There is a minimal amount of eccentric mixed echogenic plaque within the left carotid bulb (images 46 and 47), extending to involve the origin and proximal aspects of the left internal carotid artery (image 54) which results in borderline elevated peak systolic velocities with the proximal aspect the left internal carotid artery (greatest acquired peak systolic velocity with the proximal ICA measures 141 cm/sec - image 56). LEFT VERTEBRAL ARTERY:  Antegrade Flow IMPRESSION: 1. Minimal amount of left-sided atherosclerotic plaque results in elevated peak systolic velocities  with the left internal carotid artery - while potentially artifactual due to sampling at a location of turbulent flow, elevated peak systolic velocity is compatible with the lower end of the 50 to 69% luminal narrowing range. Further evaluation with CTA could be performed as clinically indicated. 2. Minimal amount of right-sided atherosclerotic plaque, not resulting in a hemodynamically significant stenosis. Electronically Signed   By: Simonne ComeJohn  Watts M.D.   On: 01/31/2016 09:22    EKG:   Orders placed or performed during the hospital encounter of 01/30/16  . ED EKG  . ED EKG  . EKG 12-Lead  . EKG 12-Lead      Management plans discussed with the patient, family and they are in agreement.  CODE STATUS:     Code Status Orders        Start  Ordered   01/31/16 0343  Full code  Continuous     01/31/16 0343    Code Status History    Date Active Date Inactive Code Status Order ID Comments User Context   01/31/2016  3:43 AM 01/31/2016  8:22 AM Full Code [id number]  Ihor Austin, MD Inpatient      TOTAL TIME TAKING CARE OF THIS PATIENT: 40 minutes.    Katharina Caper M.D on 01/31/2016 at 2:46 PM  Between 7am to 6pm - Pager - [phone]  After 6pm go to [email or web address] - password [email or web address]  [facility]  Office  [phone]  CC: Primary care physician; Pcp Not In System

## 2016-01-31 NOTE — Progress Notes (Addendum)
           To Whom It May Concern:         Mr. Ruben Gonzalez was hospitalized at [facility] 9.30.17 through 10.1.17. He is to return back to work in full capacity 10.3.17. Thank you for your understanding.        Sincerely,   Katharina Caperima Romon Devereux, MD

## 2016-01-31 NOTE — Progress Notes (Signed)
Patient off unit for cardiac testing. Will resume care upon return. Ruben Gonzalez  

## 2016-02-01 LAB — HEMOGLOBIN A1C
Hgb A1c MFr Bld: 5.8 % — ABNORMAL HIGH (ref 4.8–5.6)
Mean Plasma Glucose: 120 mg/dL

## 2019-02-17 ENCOUNTER — Emergency Department: Payer: Managed Care, Other (non HMO)

## 2019-02-17 ENCOUNTER — Other Ambulatory Visit: Payer: Self-pay

## 2019-02-17 ENCOUNTER — Encounter: Payer: Self-pay | Admitting: Emergency Medicine

## 2019-02-17 ENCOUNTER — Emergency Department
Admission: EM | Admit: 2019-02-17 | Discharge: 2019-02-17 | Disposition: A | Discharge status: Home / Self Care | Payer: Managed Care, Other (non HMO) | Attending: Student | Admitting: Student

## 2019-02-17 ENCOUNTER — Ambulatory Visit
Admission: EM | Admit: 2019-02-17 | Discharge: 2019-02-17 | Disposition: A | Discharge status: Home / Self Care | Payer: Self-pay | Attending: Family Medicine | Admitting: Family Medicine

## 2019-02-17 DIAGNOSIS — R220 Localized swelling, mass and lump, head: Secondary | ICD-10-CM | POA: Insufficient documentation

## 2019-02-17 DIAGNOSIS — F1210 Cannabis abuse, uncomplicated: Secondary | ICD-10-CM | POA: Insufficient documentation

## 2019-02-17 DIAGNOSIS — Z79899 Other long term (current) drug therapy: Secondary | ICD-10-CM | POA: Insufficient documentation

## 2019-02-17 DIAGNOSIS — I10 Essential (primary) hypertension: Secondary | ICD-10-CM | POA: Insufficient documentation

## 2019-02-17 DIAGNOSIS — L03211 Cellulitis of face: Secondary | ICD-10-CM | POA: Insufficient documentation

## 2019-02-17 DIAGNOSIS — J45909 Unspecified asthma, uncomplicated: Secondary | ICD-10-CM | POA: Insufficient documentation

## 2019-02-17 LAB — COMPREHENSIVE METABOLIC PANEL
ALT: 44 U/L (ref 0–44)
AST: 79 U/L — ABNORMAL HIGH (ref 15–41)
Albumin: 4.8 g/dL (ref 3.5–5.0)
Alkaline Phosphatase: 74 U/L (ref 38–126)
Anion gap: 14 (ref 5–15)
BUN: 9 mg/dL (ref 6–20)
CO2: 27 mmol/L (ref 22–32)
Calcium: 10.4 mg/dL — ABNORMAL HIGH (ref 8.9–10.3)
Chloride: 97 mmol/L — ABNORMAL LOW (ref 98–111)
Creatinine, Ser: 0.72 mg/dL (ref 0.61–1.24)
GFR calc Af Amer: >60 mL/min (ref >60)
GFR calc non Af Amer: >60 mL/min (ref >60)
Glucose, Bld: 115 mg/dL — ABNORMAL HIGH (ref 70–99)
Potassium: 3.2 mmol/L — ABNORMAL LOW (ref 3.5–5.1)
Sodium: 138 mmol/L (ref 135–145)
Total Bilirubin: 2.8 mg/dL — ABNORMAL HIGH (ref 0.3–1.2)
Total Protein: 8.2 g/dL — ABNORMAL HIGH (ref 6.5–8.1)

## 2019-02-17 LAB — CBC
HCT: 39.6 % (ref 39.0–52.0)
Hemoglobin: 13.1 g/dL (ref 13.0–17.0)
MCH: 30.5 pg (ref 26.0–34.0)
MCHC: 33.1 g/dL (ref 30.0–36.0)
MCV: 92.1 fL (ref 80.0–100.0)
Platelets: 64 10*3/uL — ABNORMAL LOW (ref 150–400)
RBC: 4.3 MIL/uL (ref 4.22–5.81)
RDW: 14.6 % (ref 11.5–15.5)
WBC: 4.9 10*3/uL (ref 4.0–10.5)
nRBC: 0 % (ref 0.0–0.2)

## 2019-02-17 MED ORDER — POLYMYXIN B-TRIMETHOPRIM 10000-0.1 UNIT/ML-% OP SOLN: 2.0000 [drp] | Freq: Four times a day (QID) | OPHTHALMIC | 0 refills | Status: DC

## 2019-02-17 MED ORDER — FAMOTIDINE IN NACL 20-0.9 MG/50ML-% IV SOLN
40.0000 mg | Freq: Once | INTRAVENOUS | Status: AC
  Administered 2019-02-17: 40 mg via INTRAVENOUS
  Filled 2019-02-17: qty 100

## 2019-02-17 MED ORDER — SULFAMETHOXAZOLE-TRIMETHOPRIM 800-160 MG PO TABS: 1.0000 | ORAL_TABLET | Freq: Two times a day (BID) | ORAL | 0 refills | Status: DC

## 2019-02-17 MED ORDER — SODIUM CHLORIDE 0.9 % IV SOLN: 40.0000 mg | Freq: Once | INTRAVENOUS | Status: DC

## 2019-02-17 MED ORDER — METHYLPREDNISOLONE SODIUM SUCC 125 MG IJ SOLR
125.0000 mg | Freq: Once | INTRAMUSCULAR | Status: AC
  Administered 2019-02-17: 125 mg via INTRAVENOUS
  Filled 2019-02-17: qty 2

## 2019-02-17 MED ORDER — CEPHALEXIN 500 MG PO CAPS: 500.0000 mg | ORAL_CAPSULE | Freq: Four times a day (QID) | ORAL | 0 refills | Status: AC

## 2019-02-17 MED ORDER — IOHEXOL 300 MG/ML  SOLN
75.0000 mL | Freq: Once | INTRAMUSCULAR | Status: AC | PRN
  Administered 2019-02-17: 75 mL via INTRAVENOUS

## 2019-02-17 MED ORDER — DIPHENHYDRAMINE HCL 50 MG/ML IJ SOLN
50.0000 mg | Freq: Once | INTRAMUSCULAR | Status: AC
  Administered 2019-02-17: 50 mg via INTRAVENOUS
  Filled 2019-02-17: qty 1

## 2019-02-17 MED ORDER — CEPHALEXIN 500 MG PO CAPS: 500.0000 mg | ORAL_CAPSULE | Freq: Four times a day (QID) | ORAL | 0 refills | Status: DC

## 2019-02-17 MED ORDER — POLYMYXIN B-TRIMETHOPRIM 10000-0.1 UNIT/ML-% OP SOLN: 2.0000 [drp] | Freq: Four times a day (QID) | OPHTHALMIC | 0 refills | Status: AC

## 2019-02-17 MED ORDER — SULFAMETHOXAZOLE-TRIMETHOPRIM 800-160 MG PO TABS: 1.0000 | ORAL_TABLET | Freq: Two times a day (BID) | ORAL | 0 refills | Status: AC

## 2019-02-17 MED ORDER — SODIUM CHLORIDE 0.9 % IV BOLUS: 1000.0000 mL | Freq: Once | INTRAVENOUS | Status: DC

## 2019-02-17 NOTE — Discharge Instructions (Addendum)
Thank you for letting us take care of you in the emergency department today.   Please continue to take any regular, prescribed medications.   New medications we have prescribed:  - Polytrim eye drops  - Keflex - oral antibiotic - Bactrim - oral antibiotic  Please take all 3 as directed for your eye/facial infection. Please start taking a daily over the counter allergy medicine as well, such as Zyrtec (cetirizine). You can use cool compresses to help with the swelling as well.  Please follow up with: - Your primary care doctor to review your ER visit and follow up on your symptoms.   Please return to the ER immediately for any new or worsening symptoms, such as worsening swelling, changes in your vision, eye pain, fevers, inability to tolerate your medications.

## 2019-02-17 NOTE — ED Provider Notes (Signed)
[facility] ____________________________________________  Time seen: Approximately 10:05 AM  I have reviewed the triage vital signs and the nursing notes.   HISTORY  Chief Complaint Facial Swelling (bilateral) and Allergic Reaction   HPI Ruben Gonzalez is a 48 y.o. male presenting for evaluation of bilateral eye and facial swelling.  Patient reports this past Thursday he was having watery eye drainage bilaterally which he called MD on-call and was called in erythromycin ophthalmic ointment.  States he used 2 doses Thursday into Friday to bilateral eyes.  Reports on Friday he began having bilateral eye swelling that has continued to worsen.  No alleviating measures attempted.  Has stopped using the antibiotic ointment after the 2 doses.  States difficulty seeing due to the swelling.  Has continued to have clear watery drainage bilaterally.  Denies any vision loss.  Denies known fevers.  Occasional cough.  No congestion.  Denies any other changes in contacts or medications.  Denies chest pain, shortness of breath, lip or oral edema.  No recent sickness.  Has not taken his daily medications today.  Wears reading glasses.  Does not have prescription glasses or contacts.   Past Medical History:  Diagnosis Date  . Asthma   . Hypertension     Patient Active Problem List   Diagnosis Date Noted  . Unresponsive episode 01/31/2016  . Acute renal failure ([place]) 01/31/2016  . Hypokalemia 01/31/2016  . Alcohol abuse 01/31/2016  . Marijuana abuse 01/31/2016  . Hyperglycemia 01/31/2016  . Carotid artery stenosis 01/31/2016  . Hypotension 01/31/2016  . Dehydration 01/31/2016    Past Surgical History:  Procedure Laterality Date  . VASECTOMY       No current facility-administered medications for this encounter.   Current Outpatient Medications:  .  carvedilol (COREG) 12.5 MG tablet, Take 12.5 mg by mouth 2 (two) times daily., Disp: , Rfl:  .  chlordiazePOXIDE (LIBRIUM) 25  MG capsule, TAKE 1 CAPSULE BY MOUTH FIVE TIMES PER DAY ON DAY 1. TAPER BY ONE DOSAGE PER DAY OVER NEXT 4 DAYS UNTIL GONE., Disp: , Rfl:  .  erythromycin ophthalmic ointment, APPLY TO BOTH EYES FOUR TIMES DAILY FOR 5 DAYS, Disp: , Rfl:  .  lisinopril-hydrochlorothiazide (ZESTORETIC) 20-25 MG tablet, , Disp: , Rfl:  .  PROAIR HFA 108 (90 Base) MCG/ACT inhaler, Inhale 2 puffs into the lungs every 4 (four) hours as needed., Disp: , Rfl:  .  thiamine 100 MG tablet, Take 1 tablet (100 mg total) by mouth daily., Disp: 30 tablet, Rfl: 3 .  valsartan (DIOVAN) 160 MG tablet, Take 160 mg by mouth daily., Disp: , Rfl:  .  VIAGRA 100 MG tablet, Take 1 tablet by mouth as needed., Disp: , Rfl:  .  folic acid (FOLVITE) 1 MG tablet, Take 1 tablet (1 mg total) by mouth daily., Disp: 30 tablet, Rfl: 3  Allergies Patient has no known allergies.  Family History  Problem Relation Age of Onset  . Heart attack Mother     Social History Social History   Tobacco Use  . Smoking status: Never Smoker  . Smokeless tobacco: Never Used  Substance Use Topics  . Alcohol use: Yes  . Drug use: Yes    Types: Marijuana    Review of Systems Constitutional: Denies known fevers. Eyes:As above.  ENT: No sore throat. Cardiovascular: Denies chest pain. Respiratory: Denies shortness of breath. Gastrointestinal: No abdominal pain.  Musculoskeletal: Negative for back pain. Skin: Positive rash and facial swelling. Neurological: Negative for headaches, focal  weakness or numbness.   ____________________________________________   PHYSICAL EXAM:  VITAL SIGNS: ED Triage Vitals  Enc Vitals Group     BP 02/17/19 0944 (!) 181/125     Pulse Rate 02/17/19 0944 (!) 114     Resp 02/17/19 0944 16     Temp 02/17/19 0944 99.6 F (37.6 C)     Temp Source 02/17/19 0944 Oral     SpO2 02/17/19 0944 99 %     Weight 02/17/19 0940 140 lb (63.5 kg)     Height 02/17/19 0940 5\' 9"  (1.753 m)     Head Circumference --      Peak  Flow --      Pain Score 02/17/19 0940 0     Pain Loc --      Pain Edu? --      Excl. in GC? --     Visual Acuity  Right Eye Distance: 2-/70 uncorrected Left Eye Distance: 20/100 uncorrected Bilateral Distance: 20/70 uncorrected   Constitutional: Alert and oriented. Well appearing and in no acute distress. Eyes: Minimal bilateral conjunctival injection with bilateral clear drainage.  EOMs intact with minimal tenderness.  Soft tissue swelling surrounding eyes as depicted below and image.  Swelling left eye nontender.  Minimal tenderness surrounding around right eye with ecchymosis.     ENT      Head: Normocephalic      Nose: No congestion      Mouth/Throat: Mucous membranes are moist.Oropharynx non-erythematous.  No oral pharyngeal edema noted. Neck: No stridor. Supple without meningismus.  Hematological/Lymphatic/Immunilogical: No cervical lymphadenopathy. Cardiovascular: Normal rate, regular rhythm. Grossly normal heart sounds.  Good peripheral circulation. Respiratory: Normal respiratory effort without tachypnea nor retractions. Breath sounds are clear and equal bilaterally. No wheezes, rales, rhonchi. Musculoskeletal: Unsteady gait. Neurologic:  Normal speech and language. Skin:  Skin is warm, dry Psychiatric: Mood and affect are normal. Speech and behavior are normal. Patient exhibits appropriate insight and judgment   ___________________________________________   LABS (all labs ordered are listed, but only abnormal results are displayed)  Labs Reviewed - No data to display  RADIOLOGY  No results found. ____________________________________________   PROCEDURES Procedures    INITIAL IMPRESSION / ASSESSMENT AND PLAN / ED COURSE  Pertinent labs & imaging results that were available during my care of the patient were reviewed by me and considered in my medical decision making (see chart for details). Discussed patient and plan of patient with Dr 02-04-2006, who agrees with  plan.   Patient presenting with facial swelling from bilateral eyes, right greater than left.  Patient also reports not having taken his blood pressure medications yet today.  Patient noted tachycardic and low-grade temp.  Discussed multiple differentials with patient including allergic reaction and orbital cellulitis versus preseptal cellulitis.  At this time recommend further evaluation emergency room.  Patient states he will go directly to [facility].  Patient agrees with plan.  ____________________________________________   FINAL CLINICAL IMPRESSION(S) / ED DIAGNOSES  Final diagnoses:  Facial swelling     ED Discharge Orders    None       Note: This dictation was prepared with Dragon dictation along with smaller phrase technology. Any transcriptional errors that result from this process are unintentional.         Judd Gaudier, NP 02/17/19 1031

## 2019-02-17 NOTE — Discharge Instructions (Addendum)
Go directly to emergency room.  

## 2019-02-17 NOTE — ED Triage Notes (Signed)
Patient states that he was prescribed Erythromycin eye ointment on 02/15/19.  Patient states that he has used to doses of the ointment and started to have swelling around both eyes.  Patient denies any pain.

## 2019-02-17 NOTE — ED Notes (Signed)
Pt states that he started using erythromycin drops for both eyes on Thursday. On Friday he woke up with Bilateral eye swelling. Pt states that he can still see out of both eyes. Pt denies trouble breathing or pain. Pt states that he feels like the swelling is only in his eyes. Pt stopped using drops.   Lung sounds clear bilaterally. No angioedema noted. No respiratory distress noted at this time.

## 2019-02-17 NOTE — ED Triage Notes (Signed)
Pt reports went to urgent care due to his eye draining and they gave him an ointment to put on his eyes. Pt states he used it as directed and when he awoke this am he had significant swelling around both eyes. Pt with swelling noted and purple red color around both eyes. Pt denies swelling in his throat, denies pain.

## 2019-02-17 NOTE — ED Notes (Signed)
Pt has returned to treatment room.

## 2019-02-17 NOTE — ED Notes (Signed)
Pt reports symptoms of pink eye for several days so was seen and prescribed an antibiotic ointment for his eyes. Pt reports medication to use around eyes was erythromycin. Pt reports only used one day and that was Thursday and when he awoke Friday he had facial swelling. Pt reports the swelling has continued to get worse so he went to UC and was sent here. Pt denies SOB or the feeling that his throat is closing.

## 2019-02-17 NOTE — ED Notes (Signed)
Pt taken to lobby via wheelchair and assisted to parking lot. Pt then stated he needed to use the bathroom. Pt taken to bathroom via wheelchair by this RN. Pt ambulatory into bathroom and will ambulate to ride after using the bathroom.

## 2019-02-17 NOTE — ED Provider Notes (Signed)
[facility] Emergency Department Provider Note  ____________________________________________   First MD Initiated Contact with Patient 02/17/19 1104     (approximate)  I have reviewed the triage vital signs and the nursing notes.  History  Chief Complaint Facial Swelling    HPI Ruben Gonzalez is a 48 y.o. male who presents for facial swelling.   Patient states on Thursday or Friday he sought care for clear eye drainage that had been ongoing for several weeks/months. Initially it was just the L eye but of recent also included the R which is what prompted him to seek care. Drainage has always been clear, no purulence. No eye pain or visual changes.    He was started on erythromycin ointment, which he applied 1-2 times however soon after he develop bilateral edema about the eyes, primarily the eyelids and infraorbital, with some associated ecchymosis. He believes he is having a reaction to the ointment. Again he denies any eye pain or vision changes. No lip or tongue swelling. No difficulty breathing. No other new exposures. He wears glasses, does not wear contacts.   He reports a fall 3 weeks ago but no other new trauma. A week after his fall his cousin visited and took him to the ER to be evaluated, where imaging was reportedly negative.    Past Medical Hx Past Medical History:  Diagnosis Date  . Asthma   . Hypertension     Problem List Patient Active Problem List   Diagnosis Date Noted  . Unresponsive episode 01/31/2016  . Acute renal failure (HCC) 01/31/2016  . Hypokalemia 01/31/2016  . Alcohol abuse 01/31/2016  . Marijuana abuse 01/31/2016  . Hyperglycemia 01/31/2016  . Carotid artery stenosis 01/31/2016  . Hypotension 01/31/2016  . Dehydration 01/31/2016    Past Surgical Hx Past Surgical History:  Procedure Laterality Date  . VASECTOMY      Medications Prior to Admission medications   Medication Sig Start Date End Date Taking?  Authorizing Provider  carvedilol (COREG) 12.5 MG tablet Take 12.5 mg by mouth 2 (two) times daily. 02/13/19   [provider]  chlordiazePOXIDE (LIBRIUM) 25 MG capsule TAKE 1 CAPSULE BY MOUTH FIVE TIMES PER DAY ON DAY 1. TAPER BY ONE DOSAGE PER DAY OVER NEXT 4 DAYS UNTIL GONE. 02/15/19   [provider]  erythromycin ophthalmic ointment APPLY TO BOTH EYES FOUR TIMES DAILY FOR 5 DAYS 02/15/19   [provider]  folic acid (FOLVITE) 1 MG tablet Take 1 tablet (1 mg total) by mouth daily. 02/01/16   Katharina CaperVaickute, Rima, MD  lisinopril-hydrochlorothiazide (ZESTORETIC) 20-25 MG tablet  12/14/15   [provider]  PROAIR HFA 108 (90 Base) MCG/ACT inhaler Inhale 2 puffs into the lungs every 4 (four) hours as needed. 12/18/15   [provider]  thiamine 100 MG tablet Take 1 tablet (100 mg total) by mouth daily. 02/01/16   Katharina CaperVaickute, Rima, MD  valsartan (DIOVAN) 160 MG tablet Take 160 mg by mouth daily. 02/07/19   [provider]  VIAGRA 100 MG tablet Take 1 tablet by mouth as needed. 12/04/15   [provider]    Allergies Patient has no known allergies.  Family Hx Family History  Problem Relation Age of Onset  . Heart attack Mother     Social Hx Social History   Tobacco Use  . Smoking status: Never Smoker  . Smokeless tobacco: Never Used  Substance Use Topics  . Alcohol use: Yes  . Drug use: Yes  Types: Marijuana     Review of Systems  Constitutional: Negative for fever, chills. Eyes: + periorbital swelling ENT: Negative for sore throat. Cardiovascular: Negative for chest pain. Respiratory: Negative for shortness of breath. Gastrointestinal: Negative for nausea, vomiting.  Genitourinary: Negative for dysuria. Musculoskeletal: Negative for leg swelling. Skin: Negative for rash. Neurological: Negative for for headaches.   Physical Exam  Vital Signs: ED Triage Vitals  Enc Vitals Group     BP 02/17/19 1041 (!) 204/121      Pulse Rate 02/17/19 1041 (!) 112     Resp 02/17/19 1041 20     Temp 02/17/19 1041 99.2 F (37.3 C)     Temp Source 02/17/19 1041 Oral     SpO2 02/17/19 1041 99 %     Weight 02/17/19 1042 140 lb (63.5 kg)     Height 02/17/19 1042 5\' 9"  (1.753 m)     Head Circumference --      Peak Flow --      Pain Score 02/17/19 1042 0     Pain Loc --      Pain Edu? --      Excl. in [place]? --     Constitutional: Alert and oriented.  Head: Normocephalic. Atraumatic. Eyes: Conjunctivae clear. Sclera anicteric. PERRL. Full EOMI without discomfort. No entrapment. No proptosis. Vision in tact. Bilateral periorbital edema with associated ecchymosis, R greater than L.  Nose: No congestion. No rhinorrhea. Mouth/Throat: Mucous membranes are moist. No lip or tongue swelling.  Neck: No stridor.   Cardiovascular: Normal rate. Extremities well perfused. Respiratory: Normal respiratory effort.   Musculoskeletal: No lower extremity edema. No deformities. Neurologic:  Normal speech and language. No gross focal neurologic deficits are appreciated.  Skin: Skin is warm, dry and intact. No rash noted. Psychiatric: Mood and affect are appropriate for situation.  EKG  N/A    Radiology  CT: IMPRESSION: Widespread superficial cellulitis the upper face as outlined above. No evidence of drainable abscess. No evidence of postseptal orbital inflammation. No specific underlying cause is identifiable.     Procedures  Procedure(s) performed (including critical care):  Procedures   Initial Impression / Assessment and Plan / ED Course  48 y.o. male who presents to the ED for bilateral periorbital swelling, in setting of recent eye drainage and initiating erythromycin ointment.   Ddx: allergic reaction (no evidence of airway involvement), preseptal cellulitis (no evidence of post septal - no vision changes, no pain with EOMI, no fever, no proptosis), trauma (though he denies falling aside from 3 weeks ago after  which he reportedly had negative imaging)  Plan: labs, imaging, allergy medication, reassess  Imaging reveals widespread superficial cellulitis the upper face. No evidence of drainable abscess. No evidence of postseptal orbital inflammation.   Suspect etiology being conjunctivitis given preceding eye drainage. He has a thrombocytopenia which may be why he has ecchymotic changes associated with the swelling.  His AST is mildly elevated. He does admit to drinking 3 or so beers daily, suspect this +/- some underlying liver disease is the etiology of the thrombocytopenia. Discussed this with patient and advised decreased alcohol consumption and follow up with his PCP, he is agreeable. Will prescribe oral antibiotics and switch the erythromycin ointment to Polytrim eye drops. Advised oral OTC antihistamine in case allergic reaction was contributing to his presentation, though more likely related to the cellulitis. Again, no evidence on exam or imaging of post septal involvement. Given return precautions, advised outpatient follow up.   Final Clinical Impression(s) /  ED Diagnosis  Final diagnoses:  Facial swelling  Facial cellulitis       Note:  This document was prepared using Dragon voice recognition software and may include unintentional dictation errors.   Miguel Aschoff., MD 02/17/19 2205

## 2020-08-13 ENCOUNTER — Emergency Department: Payer: Self-pay

## 2020-08-13 ENCOUNTER — Inpatient Hospital Stay
Admission: EM | Admit: 2020-08-13 | Discharge: 2020-08-15 | DRG: 202 | Disposition: A | Discharge status: Home / Self Care | Payer: Self-pay | Attending: Student | Admitting: Student

## 2020-08-13 ENCOUNTER — Other Ambulatory Visit: Payer: Self-pay

## 2020-08-13 DIAGNOSIS — Z20822 Contact with and (suspected) exposure to covid-19: Secondary | ICD-10-CM | POA: Diagnosis present

## 2020-08-13 DIAGNOSIS — I10 Essential (primary) hypertension: Secondary | ICD-10-CM

## 2020-08-13 DIAGNOSIS — J45901 Unspecified asthma with (acute) exacerbation: Principal | ICD-10-CM | POA: Diagnosis present

## 2020-08-13 DIAGNOSIS — Z79899 Other long term (current) drug therapy: Secondary | ICD-10-CM

## 2020-08-13 DIAGNOSIS — F1011 Alcohol abuse, in remission: Secondary | ICD-10-CM | POA: Diagnosis present

## 2020-08-13 DIAGNOSIS — J9601 Acute respiratory failure with hypoxia: Secondary | ICD-10-CM

## 2020-08-13 DIAGNOSIS — Z77120 Contact with and (suspected) exposure to mold (toxic): Secondary | ICD-10-CM

## 2020-08-13 DIAGNOSIS — Z8249 Family history of ischemic heart disease and other diseases of the circulatory system: Secondary | ICD-10-CM

## 2020-08-13 LAB — BASIC METABOLIC PANEL
Anion gap: 9 (ref 5–15)
BUN: 10 mg/dL (ref 6–20)
CO2: 28 mmol/L (ref 22–32)
Calcium: 10.1 mg/dL (ref 8.9–10.3)
Chloride: 102 mmol/L (ref 98–111)
Creatinine, Ser: 1.05 mg/dL (ref 0.61–1.24)
GFR, Estimated: >60 mL/min (ref >60)
Glucose, Bld: 86 mg/dL (ref 70–99)
Potassium: 4.4 mmol/L (ref 3.5–5.1)
Sodium: 139 mmol/L (ref 135–145)

## 2020-08-13 LAB — CBC
HCT: 51.5 % (ref 39.0–52.0)
HCT: 52.9 % — ABNORMAL HIGH (ref 39.0–52.0)
Hemoglobin: 17.3 g/dL — ABNORMAL HIGH (ref 13.0–17.0)
Hemoglobin: 17.1 g/dL — ABNORMAL HIGH (ref 13.0–17.0)
MCH: 30.7 pg (ref 26.0–34.0)
MCH: 29.6 pg (ref 26.0–34.0)
MCHC: 33.6 g/dL (ref 30.0–36.0)
MCHC: 32.3 g/dL (ref 30.0–36.0)
MCV: 91.3 fL (ref 80.0–100.0)
MCV: 91.5 fL (ref 80.0–100.0)
Platelets: 269 10*3/uL (ref 150–400)
Platelets: 300 10*3/uL (ref 150–400)
RBC: 5.64 MIL/uL (ref 4.22–5.81)
RBC: 5.78 MIL/uL (ref 4.22–5.81)
RDW: 12.9 % (ref 11.5–15.5)
RDW: 12.9 % (ref 11.5–15.5)
WBC: 6.1 10*3/uL (ref 4.0–10.5)
WBC: 5.6 10*3/uL (ref 4.0–10.5)
nRBC: 0 % (ref 0.0–0.2)
nRBC: 0 % (ref 0.0–0.2)

## 2020-08-13 LAB — CREATININE, SERUM
Creatinine, Ser: 1 mg/dL (ref 0.61–1.24)
GFR, Estimated: >60 mL/min (ref >60)

## 2020-08-13 LAB — RESP PANEL BY RT-PCR (FLU A&B, COVID) ARPGX2
Influenza A by PCR: NEGATIVE
Influenza B by PCR: NEGATIVE
SARS Coronavirus 2 by RT PCR: NEGATIVE

## 2020-08-13 LAB — HIV ANTIBODY (ROUTINE TESTING W REFLEX): HIV Screen 4th Generation wRfx: NONREACTIVE

## 2020-08-13 MED ORDER — AZITHROMYCIN 500 MG PO TABS: 500.0000 mg | ORAL_TABLET | Freq: Every day | ORAL | 0 refills | Status: DC

## 2020-08-13 MED ORDER — THIAMINE HCL 100 MG PO TABS
100.0000 mg | ORAL_TABLET | Freq: Every day | ORAL | Status: DC
  Administered 2020-08-13 – 2020-08-15 (×3): 100 mg via ORAL
  Filled 2020-08-13 (×3): qty 1

## 2020-08-13 MED ORDER — METOPROLOL TARTRATE 5 MG/5ML IV SOLN
2.5000 mg | INTRAVENOUS | Status: DC | PRN
  Filled 2020-08-13: qty 5

## 2020-08-13 MED ORDER — FLUTICASONE FUROATE-VILANTEROL 100-25 MCG/INH IN AEPB
1.0000 | INHALATION_SPRAY | Freq: Every day | RESPIRATORY_TRACT | Status: DC
  Administered 2020-08-13 – 2020-08-15 (×3): 1 via RESPIRATORY_TRACT
  Filled 2020-08-13: qty 28

## 2020-08-13 MED ORDER — METHYLPREDNISOLONE SODIUM SUCC 125 MG IJ SOLR
80.0000 mg | Freq: Every day | INTRAMUSCULAR | Status: DC
  Administered 2020-08-13 – 2020-08-14 (×2): 80 mg via INTRAVENOUS
  Filled 2020-08-13 (×2): qty 2

## 2020-08-13 MED ORDER — FOLIC ACID 1 MG PO TABS
1.0000 mg | ORAL_TABLET | Freq: Every day | ORAL | Status: DC
  Administered 2020-08-13 – 2020-08-15 (×3): 1 mg via ORAL
  Filled 2020-08-13 (×3): qty 1

## 2020-08-13 MED ORDER — PREDNISONE 20 MG PO TABS: 40.0000 mg | ORAL_TABLET | Freq: Every day | ORAL | 0 refills | Status: DC

## 2020-08-13 MED ORDER — AMLODIPINE BESYLATE 5 MG PO TABS
5.0000 mg | ORAL_TABLET | Freq: Every day | ORAL | Status: DC
  Administered 2020-08-13 – 2020-08-15 (×3): 5 mg via ORAL
  Filled 2020-08-13 (×3): qty 1

## 2020-08-13 MED ORDER — CARVEDILOL 12.5 MG PO TABS
12.5000 mg | ORAL_TABLET | Freq: Two times a day (BID) | ORAL | Status: DC
  Administered 2020-08-13 – 2020-08-15 (×5): 12.5 mg via ORAL
  Filled 2020-08-13 (×5): qty 1

## 2020-08-13 MED ORDER — PANTOPRAZOLE SODIUM 40 MG PO TBEC
40.0000 mg | DELAYED_RELEASE_TABLET | Freq: Every day | ORAL | Status: AC
  Administered 2020-08-13 – 2020-08-15 (×3): 40 mg via ORAL
  Filled 2020-08-13 (×3): qty 1

## 2020-08-13 MED ORDER — IPRATROPIUM-ALBUTEROL 0.5-2.5 (3) MG/3ML IN SOLN
3.0000 mL | Freq: Once | RESPIRATORY_TRACT | Status: AC
  Administered 2020-08-13: 3 mL via RESPIRATORY_TRACT
  Filled 2020-08-13: qty 3

## 2020-08-13 MED ORDER — ALBUTEROL SULFATE (5 MG/ML) 0.5% IN NEBU: 2.5000 mg | INHALATION_SOLUTION | RESPIRATORY_TRACT | 12 refills | Status: DC | PRN

## 2020-08-13 MED ORDER — ENOXAPARIN SODIUM 40 MG/0.4ML ~~LOC~~ SOLN
40.0000 mg | SUBCUTANEOUS | Status: DC
  Administered 2020-08-13 – 2020-08-14 (×2): 40 mg via SUBCUTANEOUS
  Filled 2020-08-13 (×2): qty 0.4

## 2020-08-13 MED ORDER — ALBUTEROL SULFATE (2.5 MG/3ML) 0.083% IN NEBU
2.5000 mg | INHALATION_SOLUTION | Freq: Once | RESPIRATORY_TRACT | Status: AC
  Administered 2020-08-13: 2.5 mg via RESPIRATORY_TRACT
  Filled 2020-08-13: qty 3

## 2020-08-13 MED ORDER — METHYLPREDNISOLONE SODIUM SUCC 125 MG IJ SOLR
125.0000 mg | Freq: Once | INTRAMUSCULAR | Status: AC
  Administered 2020-08-13: 125 mg via INTRAVENOUS
  Filled 2020-08-13: qty 2

## 2020-08-13 MED ORDER — IPRATROPIUM-ALBUTEROL 0.5-2.5 (3) MG/3ML IN SOLN
3.0000 mL | Freq: Four times a day (QID) | RESPIRATORY_TRACT | Status: DC
  Administered 2020-08-13 – 2020-08-15 (×8): 3 mL via RESPIRATORY_TRACT
  Filled 2020-08-13 (×8): qty 3

## 2020-08-13 MED ORDER — ALBUTEROL SULFATE HFA 108 (90 BASE) MCG/ACT IN AERS: 2.0000 | INHALATION_SPRAY | Freq: Four times a day (QID) | RESPIRATORY_TRACT | 2 refills | Status: DC | PRN

## 2020-08-13 NOTE — ED Notes (Signed)
This RN assumed care, on entering room pt's O2 saturation 86% on room air at rest. This RN encourage pt to take deep breath, O2 saturation only increased to 88%. This RN placed pt on 2L nasal cannula and O2 saturation increase to 93%.

## 2020-08-13 NOTE — ED Provider Notes (Signed)
[facility] Emergency Department Provider Note    Event Date/Time   First MD Initiated Contact with Patient 08/13/20 [phone]     (approximate)  I have reviewed the triage vital signs and the nursing notes.   HISTORY  Chief Complaint Shortness of Breath    HPI Ruben Gonzalez is a 50 y.o. male history of asthma on bronchodilator therapy at home presents to the ER for 1 week of worsening shortness of breath wheezing and cough.  Denies any chest pain or pressure.  No measured fevers or chills.  States that he thinks that is some mold where he lives that is triggered his breathing issues.  He does not smoke.  Has had asthma since he was a young child.    Past Medical History:  Diagnosis Date  . Asthma   . Hypertension    Family History  Problem Relation Age of Onset  . Heart attack Mother    Past Surgical History:  Procedure Laterality Date  . VASECTOMY     Patient Active Problem List   Diagnosis Date Noted  . Unresponsive episode 01/31/2016  . Acute renal failure (HCC) 01/31/2016  . Hypokalemia 01/31/2016  . Alcohol abuse 01/31/2016  . Marijuana abuse 01/31/2016  . Hyperglycemia 01/31/2016  . Carotid artery stenosis 01/31/2016  . Hypotension 01/31/2016  . Dehydration 01/31/2016      Prior to Admission medications   Medication Sig Start Date End Date Taking? Authorizing Provider  albuterol (PROVENTIL) (5 MG/ML) 0.5% nebulizer solution Inhale 0.5 mLs (2.5 mg total) into the lungs every 4 (four) hours as needed for wheezing. 08/13/20  Yes Willy Eddy, MD  albuterol (VENTOLIN HFA) 108 (90 Base) MCG/ACT inhaler Inhale 2 puffs into the lungs every 6 (six) hours as needed for wheezing or shortness of breath. 08/13/20  Yes Willy Eddy, MD  azithromycin (ZITHROMAX) 500 MG tablet Take 1 tablet (500 mg total) by mouth daily for 3 days. Take 1 tablet daily for 3 days. 08/13/20 08/16/20 Yes Willy Eddy, MD  predniSONE (DELTASONE) 20 MG  tablet Take 2 tablets (40 mg total) by mouth daily for 5 days. 08/13/20 08/18/20 Yes Willy Eddy, MD  carvedilol (COREG) 12.5 MG tablet Take 12.5 mg by mouth 2 (two) times daily. 02/13/19   [provider]  chlordiazePOXIDE (LIBRIUM) 25 MG capsule TAKE 1 CAPSULE BY MOUTH FIVE TIMES PER DAY ON DAY 1. TAPER BY ONE DOSAGE PER DAY OVER NEXT 4 DAYS UNTIL GONE. 02/15/19   [provider]  erythromycin ophthalmic ointment APPLY TO BOTH EYES FOUR TIMES DAILY FOR 5 DAYS 02/15/19   [provider]  folic acid (FOLVITE) 1 MG tablet Take 1 tablet (1 mg total) by mouth daily. 02/01/16   Katharina Caper, MD  lisinopril-hydrochlorothiazide (ZESTORETIC) 20-25 MG tablet  12/14/15   [provider]  thiamine 100 MG tablet Take 1 tablet (100 mg total) by mouth daily. 02/01/16   Katharina Caper, MD  valsartan (DIOVAN) 160 MG tablet Take 160 mg by mouth daily. 02/07/19   [provider]  VIAGRA 100 MG tablet Take 1 tablet by mouth as needed. 12/04/15   [provider]    Allergies Patient has no known allergies.    Social History Social History   Tobacco Use  . Smoking status: Never Smoker  . Smokeless tobacco: Never Used  Vaping Use  . Vaping Use: Never used  Substance Use Topics  . Alcohol use: Yes  . Drug use: Yes    Types: Marijuana  Review of Systems Patient denies headaches, rhinorrhea, blurry vision, numbness, shortness of breath, chest pain, edema, cough, abdominal pain, nausea, vomiting, diarrhea, dysuria, fevers, rashes or hallucinations unless otherwise stated above in HPI. ____________________________________________   PHYSICAL EXAM:  VITAL SIGNS: Vitals:   08/13/20 1145 08/13/20 1200  BP:  (!) 159/106  Pulse: (!) 103 (!) 105  Resp: 16 (!) 24  Temp:    SpO2: 95% 94%    Constitutional: Alert and oriented.  Eyes: Conjunctivae are normal.  Head: Atraumatic. Nose: No congestion/rhinnorhea. Mouth/Throat: Mucous membranes are  moist.   Neck: No stridor. Painless ROM.  Cardiovascular: Normal rate, regular rhythm. Grossly normal heart sounds.  Good peripheral circulation. Respiratory: Normal respiratory effort.  No retractions. Lungs with diffuse coarse wheezing throughout,  Speaking in complete phrases Gastrointestinal: Soft and nontender. No distention. No abdominal bruits. No CVA tenderness. Genitourinary:  Musculoskeletal: No lower extremity tenderness nor edema.  No joint effusions. Neurologic:  Normal speech and language. No gross focal neurologic deficits are appreciated. No facial droop Skin:  Skin is warm, dry and intact. No rash noted. Psychiatric: Mood and affect are normal. Speech and behavior are normal.  ____________________________________________   LABS (all labs ordered are listed, but only abnormal results are displayed)  Results for orders placed or performed during the hospital encounter of 08/13/20 (from the past 24 hour(s))  CBC     Status: Abnormal   Collection Time: 08/13/20  7:31 AM  Result Value Ref Range   WBC 6.1 4.0 - 10.5 K/uL   RBC 5.64 4.22 - 5.81 MIL/uL   Hemoglobin 17.3 (H) 13.0 - 17.0 g/dL   HCT 40.0 86.7 - 61.9 %   MCV 91.3 80.0 - 100.0 fL   MCH 30.7 26.0 - 34.0 pg   MCHC 33.6 30.0 - 36.0 g/dL   RDW 50.9 32.6 - 71.2 %   Platelets 269 150 - 400 K/uL   nRBC 0.0 0.0 - 0.2 %  Basic metabolic panel     Status: None   Collection Time: 08/13/20  7:31 AM  Result Value Ref Range   Sodium 139 135 - 145 mmol/L   Potassium 4.4 3.5 - 5.1 mmol/L   Chloride 102 98 - 111 mmol/L   CO2 28 22 - 32 mmol/L   Glucose, Bld 86 70 - 99 mg/dL   BUN 10 6 - 20 mg/dL   Creatinine, Ser 4.58 0.61 - 1.24 mg/dL   Calcium 09.9 8.9 - 83.3 mg/dL   GFR, Estimated >82 >50 mL/min   Anion gap 9 5 - 15  Resp Panel by RT-PCR (Flu A&B, Covid) Nasopharyngeal Swab     Status: None   Collection Time: 08/13/20 11:37 AM   Specimen: Nasopharyngeal Swab; Nasopharyngeal(NP) swabs in vial transport medium   Result Value Ref Range   SARS Coronavirus 2 by RT PCR NEGATIVE NEGATIVE   Influenza A by PCR NEGATIVE NEGATIVE   Influenza B by PCR NEGATIVE NEGATIVE   ____________________________________________  EKG My review and personal interpretation at Time: 7:23   Indication: sob  Rate: 115  Rhythm: sinus Axis: normal Other: normal intervals, no stemi ____________________________________________  RADIOLOGY  I personally reviewed all radiographic images ordered to evaluate for the above acute complaints and reviewed radiology reports and findings.  These findings were personally discussed with the patient.  Please see medical record for radiology report.  ____________________________________________   PROCEDURES  Procedure(s) performed:  Procedures    Critical Care performed: no ____________________________________________   INITIAL IMPRESSION / ASSESSMENT AND  PLAN / ED COURSE  Pertinent labs & imaging results that were available during my care of the patient were reviewed by me and considered in my medical decision making (see chart for details).   DDX: Asthma, copd, CHF, pna, ptx, malignancy, Pe, anemia   Ruben Gonzalez is a 50 y.o. who presents to the ED with presentation as described above.  Patient well-appearing nontoxic.  Does have diffuse coarse wheezing throughout on lung exam.  Presentation most consistent with asthma.  The patient will be placed on continuous pulse oximetry and telemetry for monitoring.  Laboratory evaluation will be sent to evaluate for the above complaints.     Clinical Course as of 08/13/20 1249  Thu Aug 13, 2020  0845 Patient reassessed.  He is feeling significantly improved.  Will give additional neb as he is still having wheezing and observe.  Will ambulate to make sure he is not hypoxic but overall I think he is appropriate for outpatient follow-up.  As he is reporting some thick phlegm production with his acute bronchitis will give course of  azithromycin as well for its anti-inflammatory effects.  Will order course of steroid. [PR]  1233 During period of observation patient actually found to be hypoxic with ambulation and at rest.  He was hypoxic down to the mid 80s.  Placed on 2 L nasal cannula.  Repeat exam showed persistent wheezing but is overall improving but based on his hypoxia I do believe he warrants hospitalization for further medical management.  Does not seem consistent with pneumonia or PE.  No findings to suggest CHF.  Will consult hospitalist. [PR]    Clinical Course User Index [PR] Willy Eddy, MD    The patient was evaluated in Emergency Department today for the symptoms described in the history of present illness. He/she was evaluated in the context of the global COVID-19 pandemic, which necessitated consideration that the patient might be at risk for infection with the SARS-CoV-2 virus that causes COVID-19. Institutional protocols and algorithms that pertain to the evaluation of patients at risk for COVID-19 are in a state of rapid change based on information released by regulatory bodies including the CDC and federal and state organizations. These policies and algorithms were followed during the patient's care in the ED.  As part of my medical decision making, I reviewed the following data within the electronic MEDICAL RECORD NUMBER Nursing notes reviewed and incorporated, Labs reviewed, notes from prior ED visits and [place] Controlled Substance Database   ____________________________________________   FINAL CLINICAL IMPRESSION(S) / ED DIAGNOSES  Final diagnoses:  Mild asthma with exacerbation, unspecified whether persistent  Acute respiratory failure with hypoxia (HCC)      NEW MEDICATIONS STARTED DURING THIS VISIT:  New Prescriptions   ALBUTEROL (VENTOLIN HFA) 108 (90 BASE) MCG/ACT INHALER    Inhale 2 puffs into the lungs every 6 (six) hours as needed for wheezing or shortness of breath.   AZITHROMYCIN  (ZITHROMAX) 500 MG TABLET    Take 1 tablet (500 mg total) by mouth daily for 3 days. Take 1 tablet daily for 3 days.   PREDNISONE (DELTASONE) 20 MG TABLET    Take 2 tablets (40 mg total) by mouth daily for 5 days.     Note:  This document was prepared using Dragon voice recognition software and may include unintentional dictation errors.    Willy Eddy, MD 08/13/20 1249

## 2020-08-13 NOTE — ED Notes (Signed)
Pt given crackers, peanut butter, and ice water 

## 2020-08-13 NOTE — ED Notes (Signed)
Pharmacy at bedside

## 2020-08-13 NOTE — ED Notes (Signed)
This tech assisted Pt in ambulating while monitoring pulse ox. SPo2 fluctuated between 89-93% on room air while ambulating. Pt returned to bed and is at 93% on room air.

## 2020-08-13 NOTE — ED Notes (Signed)
Informed RN bed assigned [id number]

## 2020-08-13 NOTE — ED Notes (Signed)
Patient is resting comfortably. Call light in reach. Fall precautions in place. Pt requests warm blanket.

## 2020-08-13 NOTE — H&P (Addendum)
History and Physical  Ruben Gonzalez RCV:[id number] DOB: 01/19/1971 DOA: 08/13/2020  Referring physician: Dr. Roxan Hockey, EDP PCP: Barbette Reichmann, MD  Outpatient Specialists: None Patient coming from: Shelter  Chief Complaint: Shortness of breath.  HPI: Ruben Gonzalez is a 50 y.o. male with medical history significant for asthma, essential hypertension, prior marijuana and alcohol use, who presented to [facility] due to worsening dyspnea of 1&1/2-week duration.  Patient stated that he was admitted at a shelter for persons with substance abuse.  Self-reported that the building is over 16 years old and since he stayed there he began having worsening dyspnea.  He thinks it is secondary to exposure to mold and asbestos from 52 year old building.  He denies use of tobacco or marijuana for the past 3 weeks.  No exposure to secondhand smoking.  Denies any fever chills or chest pain.  Presented to the ED for further evaluation and management.  ED Course:  Afebrile.  BP 160/115, heart rate 102, respiratory 20, O2 saturation 93% on 2 L.  Lab studies remarkable for negative COVID-19 screening test.  Rest of labs essentially unremarkable.  Chest x-ray no evidence of active cardiopulmonary disease.  Review of Systems: Review of systems as noted in the HPI. All other systems reviewed and are negative.   Past Medical History:  Diagnosis Date  . Asthma   . Hypertension    Past Surgical History:  Procedure Laterality Date  . VASECTOMY      Social History:  reports that he has never smoked. He has never used smokeless tobacco. He reports current alcohol use. He reports current drug use. Drug: Marijuana.   No Known Allergies  Family History  Problem Relation Age of Onset  . Heart attack Mother       Prior to Admission medications   Medication Sig Start Date End Date Taking? Authorizing Provider  albuterol (PROVENTIL) (5 MG/ML) 0.5% nebulizer solution Inhale 0.5 mLs (2.5 mg total) into the  lungs every 4 (four) hours as needed for wheezing. 08/13/20  Yes Willy Eddy, MD  albuterol (VENTOLIN HFA) 108 (90 Base) MCG/ACT inhaler Inhale 2 puffs into the lungs every 6 (six) hours as needed for wheezing or shortness of breath. 08/13/20  Yes Willy Eddy, MD  azithromycin (ZITHROMAX) 500 MG tablet Take 1 tablet (500 mg total) by mouth daily for 3 days. Take 1 tablet daily for 3 days. 08/13/20 08/16/20 Yes Willy Eddy, MD  predniSONE (DELTASONE) 20 MG tablet Take 2 tablets (40 mg total) by mouth daily for 5 days. 08/13/20 08/18/20 Yes Willy Eddy, MD  carvedilol (COREG) 12.5 MG tablet Take 12.5 mg by mouth 2 (two) times daily. 02/13/19   [provider]  chlordiazePOXIDE (LIBRIUM) 25 MG capsule TAKE 1 CAPSULE BY MOUTH FIVE TIMES PER DAY ON DAY 1. TAPER BY ONE DOSAGE PER DAY OVER NEXT 4 DAYS UNTIL GONE. 02/15/19   [provider]  erythromycin ophthalmic ointment APPLY TO BOTH EYES FOUR TIMES DAILY FOR 5 DAYS 02/15/19   [provider]  folic acid (FOLVITE) 1 MG tablet Take 1 tablet (1 mg total) by mouth daily. 02/01/16   Katharina Caper, MD  lisinopril-hydrochlorothiazide (ZESTORETIC) 20-25 MG tablet  12/14/15   [provider]  thiamine 100 MG tablet Take 1 tablet (100 mg total) by mouth daily. 02/01/16   Katharina Caper, MD  valsartan (DIOVAN) 160 MG tablet Take 160 mg by mouth daily. 02/07/19   [provider]  VIAGRA 100 MG tablet Take 1 tablet by mouth as needed.  12/04/15   [provider]    Physical Exam: BP (!) 170/116   Pulse (!) 101   Temp 98.6 F (37 C) (Oral)   Resp 16   Ht 5\' 9"  (1.753 m)   Wt 72.6 kg   SpO2 90%   BMI 23.63 kg/m   . General: 50 y.o. year-old male well developed well nourished in no acute distress.  Alert and oriented x3. . Cardiovascular: Regular rate and rhythm with no rubs or gallops.  No thyromegaly or JVD noted.  No lower extremity edema. 2/4 pulses in all 4 extremities. 44 Respiratory:  Diffuse wheezing bilaterally. Good inspiratory effort. . Abdomen: Soft nontender nondistended with normal bowel sounds x4 quadrants. . Muskuloskeletal: No cyanosis, clubbing or edema noted bilaterally . Neuro: CN II-XII intact, strength, sensation, reflexes . Skin: No ulcerative lesions noted or rashes . Psychiatry: Judgement and insight appear normal. Mood is appropriate for condition and setting          Labs on Admission:  Basic Metabolic Panel: Recent Labs  Lab 08/13/20 0731  NA 139  K 4.4  CL 102  CO2 28  GLUCOSE 86  BUN 10  CREATININE 1.05  CALCIUM 10.1   Liver Function Tests: No results for input(s): AST, ALT, ALKPHOS, BILITOT, PROT, ALBUMIN in the last 168 hours. No results for input(s): LIPASE, AMYLASE in the last 168 hours. No results for input(s): AMMONIA in the last 168 hours. CBC: Recent Labs  Lab 08/13/20 0731  WBC 6.1  HGB 17.3*  HCT 51.5  MCV 91.3  PLT 269   Cardiac Enzymes: No results for input(s): CKTOTAL, CKMB, CKMBINDEX, TROPONINI in the last 168 hours.  BNP (last 3 results) No results for input(s): BNP in the last 8760 hours.  ProBNP (last 3 results) No results for input(s): PROBNP in the last 8760 hours.  CBG: No results for input(s): GLUCAP in the last 168 hours.  Radiological Exams on Admission: DG Chest 2 View  Result Date: 08/13/2020 CLINICAL DATA:  Dyspnea EXAM: CHEST - 2 VIEW COMPARISON:  None. FINDINGS: The lungs are symmetrically well expanded and are clear. No pneumothorax or pleural effusion. Bronchitic changes are noted within the perihilar regions. Cardiac size within normal limits. The pulmonary vascularity is normal. No acute bone abnormality. IMPRESSION: Perihilar bronchitic changes. No superimposed focal pulmonary infiltrate. Electronically Signed   By: 08/15/2020 MD   On: 08/13/2020 08:06    EKG: I independently viewed the EKG done and my findings are as followed: Sinus tachycardia rate of 116, nonspecific ST-T changes.   QTc 452.  Assessment/Plan Present on Admission: . Asthma exacerbation  Active Problems:   Asthma exacerbation  Acute asthma exacerbation, self-reported secondary to mold exposure. Patient reports since he moved into shelter presumably 52 years old building he has experienced shortness of breath which has gradually worsened despite the use of his inhaler. On presentation, diffuse wheezing and hypoxemia with O2 saturation in the mid 80s on room air. Was given IV steroids and bronchodilators in the ED with improvement of his symptomatology. Continue bronchodilators Continue IV Solu-Medrol 80 mg daily. Add p.o. Protonix 40 mg daily as GI prophylaxis while on IV Solu-Medrol.  Acute hypoxic respiratory failure secondary to asthma exacerbation Not on oxygen supplementation at baseline On presentation O2 saturation in the mid 80s on room air. Maintain O2 saturation greater than 90%. Continue to treat underlying condition Home O2 evaluation on 08/14/2020  Essential hypertension BP is currently not at goal Resume home Coreg 12.5 mg  twice daily. Monitor vital signs  Former marijuana and alcohol use States he has not used in 3 weeks. Polysubstance cessation counseling No evidence of alcohol withdrawal at time of this visit. Continue multivitamin, folic acid and thiamine supplement.  Social issues Reports the building where he stays makes him sick TOC consulted to assist with disposition    DVT prophylaxis: Subcu Lovenox daily.  Code Status: Full code as stated by the patient himself.  Family Communication: None at bedside.  Disposition Plan: Admit to MedSurg unit with remote telemetry.  Consults called: None.  Admission status: Observation status.   Status is: Observation    Dispo: The patient is from: Shelter              Anticipated d/c is to: Unknown at this time.              Patient currently not stable for discharge due to ongoing management of asthma  exacerbation.   Difficult to place patient, not applicable.       Darlin Drop MD [facility] Pager [phone]  If 7PM-7AM, please contact night-coverage [email or web address] Password [facility]  08/13/2020, 12:54 PM

## 2020-08-13 NOTE — ED Notes (Signed)
Pt to ED from home for shob for weeks. Denies cp. Speaking in complete sentences with NAD. Pt alert and oriented, clear speech.

## 2020-08-14 ENCOUNTER — Other Ambulatory Visit: Payer: Self-pay

## 2020-08-14 DIAGNOSIS — J4541 Moderate persistent asthma with (acute) exacerbation: Secondary | ICD-10-CM

## 2020-08-14 DIAGNOSIS — I10 Essential (primary) hypertension: Secondary | ICD-10-CM

## 2020-08-14 DIAGNOSIS — J9601 Acute respiratory failure with hypoxia: Secondary | ICD-10-CM

## 2020-08-14 LAB — CBC
HCT: 49.8 % (ref 39.0–52.0)
Hemoglobin: 16.6 g/dL (ref 13.0–17.0)
MCH: 30.2 pg (ref 26.0–34.0)
MCHC: 33.3 g/dL (ref 30.0–36.0)
MCV: 90.5 fL (ref 80.0–100.0)
Platelets: 333 10*3/uL (ref 150–400)
RBC: 5.5 MIL/uL (ref 4.22–5.81)
RDW: 12.7 % (ref 11.5–15.5)
WBC: 13.3 10*3/uL — ABNORMAL HIGH (ref 4.0–10.5)
nRBC: 0 % (ref 0.0–0.2)

## 2020-08-14 LAB — BASIC METABOLIC PANEL
Anion gap: 12 (ref 5–15)
BUN: 16 mg/dL (ref 6–20)
CO2: 23 mmol/L (ref 22–32)
Calcium: 9.8 mg/dL (ref 8.9–10.3)
Chloride: 103 mmol/L (ref 98–111)
Creatinine, Ser: 0.79 mg/dL (ref 0.61–1.24)
GFR, Estimated: >60 mL/min (ref >60)
Glucose, Bld: 115 mg/dL — ABNORMAL HIGH (ref 70–99)
Potassium: 4.4 mmol/L (ref 3.5–5.1)
Sodium: 138 mmol/L (ref 135–145)

## 2020-08-14 LAB — MAGNESIUM: Magnesium: 1.8 mg/dL (ref 1.7–2.4)

## 2020-08-14 LAB — PHOSPHORUS: Phosphorus: 4.5 mg/dL (ref 2.5–4.6)

## 2020-08-14 MED ORDER — IRBESARTAN 150 MG PO TABS
150.0000 mg | ORAL_TABLET | Freq: Every day | ORAL | 0 refills | Status: DC
  Filled 2020-08-14: qty 30, 30d supply, fill #0

## 2020-08-14 MED ORDER — AZITHROMYCIN 250 MG PO TABS
ORAL_TABLET | ORAL | 0 refills | Status: DC
  Filled 2020-08-14: qty 3, 3d supply, fill #0

## 2020-08-14 MED ORDER — AZITHROMYCIN 250 MG PO TABS
250.0000 mg | ORAL_TABLET | Freq: Every day | ORAL | Status: DC
  Administered 2020-08-15: 250 mg via ORAL
  Filled 2020-08-14: qty 1

## 2020-08-14 MED ORDER — AZITHROMYCIN 500 MG PO TABS
500.0000 mg | ORAL_TABLET | Freq: Every day | ORAL | Status: AC
  Administered 2020-08-14: 500 mg via ORAL
  Filled 2020-08-14: qty 1

## 2020-08-14 MED ORDER — AMLODIPINE BESYLATE 5 MG PO TABS
5.0000 mg | ORAL_TABLET | Freq: Every day | ORAL | 0 refills | Status: DC
  Filled 2020-08-14: qty 30, 30d supply, fill #0

## 2020-08-14 MED ORDER — IRBESARTAN 150 MG PO TABS
150.0000 mg | ORAL_TABLET | Freq: Every day | ORAL | Status: DC
  Administered 2020-08-14 – 2020-08-15 (×2): 150 mg via ORAL
  Filled 2020-08-14 (×2): qty 1

## 2020-08-14 MED ORDER — PREDNISONE 20 MG PO TABS
40.0000 mg | ORAL_TABLET | Freq: Every day | ORAL | 0 refills | Status: AC
  Filled 2020-08-14: qty 4, 2d supply, fill #0

## 2020-08-14 MED ORDER — CARVEDILOL 12.5 MG PO TABS
12.5000 mg | ORAL_TABLET | Freq: Two times a day (BID) | ORAL | 0 refills | Status: DC
  Filled 2020-08-14: qty 60, 30d supply, fill #0

## 2020-08-14 MED ORDER — ALBUTEROL SULFATE 1.25 MG/3ML IN NEBU
2.5000 mg | INHALATION_SOLUTION | RESPIRATORY_TRACT | 0 refills | Status: DC | PRN
  Filled 2020-08-14: qty 90, 45d supply, fill #0

## 2020-08-14 MED ORDER — ALBUTEROL SULFATE HFA 108 (90 BASE) MCG/ACT IN AERS
2.0000 | INHALATION_SPRAY | Freq: Four times a day (QID) | RESPIRATORY_TRACT | 0 refills | Status: DC | PRN
  Filled 2020-08-14: qty 6.7, 25d supply, fill #0

## 2020-08-14 MED ORDER — METHYLPREDNISOLONE SODIUM SUCC 40 MG IJ SOLR
40.0000 mg | Freq: Two times a day (BID) | INTRAMUSCULAR | Status: DC
  Administered 2020-08-14 (×2): 40 mg via INTRAVENOUS
  Filled 2020-08-14 (×2): qty 1

## 2020-08-14 MED ORDER — BREO ELLIPTA 100-25 MCG/INH IN AEPB
1.0000 | INHALATION_SPRAY | Freq: Every day | RESPIRATORY_TRACT | 0 refills | Status: DC
  Filled 2020-08-14: qty 30, 30d supply, fill #0

## 2020-08-14 NOTE — Progress Notes (Signed)
Met with the patient at the bedside, He has been staying at an Alcohol abuse rehab for the past 3 weeks He has been clean and sober for 3 weeks he stated He does not have insurance and was [facility], [facility] sent  He needs to get his medications at [facility] He is independent at home  He stated he does not need Substance abuse resources

## 2020-08-14 NOTE — Progress Notes (Signed)
Pt's O2 sat on room air with ambulation at 95%

## 2020-08-14 NOTE — Plan of Care (Signed)

## 2020-08-14 NOTE — Discharge Instructions (Signed)
Asthma Attack  Asthma attack, also called acute bronchospasm, is the sudden narrowing and tightening of the air passages, which limits the amount of oxygen that can get into the lungs. The narrowing is caused by inflammation and tightening of the muscles in the air tubes (bronchi) in the lungs. Too much mucus is also produced, which narrows the airways more. This can cause trouble breathing, loud breathing (wheezing), and coughing. The goal of treatment is to open the airways in the lungs and reduce inflammation. What are the causes? Possible causes or triggers of this condition include:  Animal dander, dust mites, or cockroaches.  Mold, pollen from trees or grass, or cold air.  Air pollutants such as dust, household cleaners, aerosol sprays, strong chemicals, strong odors, and smoke of any kind.  Stress or strong emotions such as crying or laughing hard.  Exercise or activity that requires a lot of energy.  Substances in foods and drinks, such as dried fruits and wine, called sulfites.  Certain medicines or medical conditions such as: ? Aspirin or beta-blockers. ? Infections or inflammatory conditions, such as a flu (influenza), a cold, pneumonia, or inflammation of the nasal membranes (rhinitis). ? Gastroesophageal reflux disease (GERD). GERD is a condition in which stomach acid backs up into your esophagus and spills into your trachea (windpipe), which can irritate your airways. What are the signs or symptoms? Symptoms of this condition include:  Wheezing. This may sound like whistling while breathing. This may only happen at night.  Excessive coughing. This may only happen at night.  Chest tightness or pain.  Shortness of breath.  Feeling like you cannot get enough air no matter how hard you breathe (air hunger). How is this diagnosed? This condition may be diagnosed based on:  Your medical history.  Your symptoms.  A physical exam.  Tests to check for other causes of  your symptoms or other conditions that may have triggered your asthma attack. These tests may include: ? A chest X-ray. ? Blood tests. ? Tests to assess lung function, such as breathing into a device that measures how much air you can inhale and exhale (spirometry). How is this treated? Treatment for this condition depends on the severity and cause of your asthma attack.  For mild attacks, you may receive medicines through a hand-held inhaler (metered dose inhaler, or MDI) or through a device that turns liquid medicine into a mist (nebulizer). These medicines include: ? Quick relief or rescue medicines that quickly relax the airways and lungs. ? Long-acting medicines that are used daily to prevent (control) your asthma symptoms.  For moderate or severe attacks, you may be treated with steroid medicines by mouth or through an IV injection at the hospital.  For severe attacks, you may need oxygen therapy or a breathing machine (ventilator).  If your asthma attack was caused by an infection from bacteria, you will be given antibiotic medicines. Follow these instructions at home: Medicines  Take over-the-counter and prescription medicines only as told by your health care provider. ? Keep your medicines up-to-date. ? Make sure you have all of your medicines available at all times.  If you were prescribed an antibiotic medicine, take it as told by your health care provider. Do not stop taking the antibiotic even if you start to feel better.  Tell your doctor if you may be pregnant to make sure your asthma medicine is safe to use during pregnancy. Avoiding triggers  Keep track of things that trigger your asthma attacks. Avoid   exposure to these triggers.  Do not use any products that contain nicotine or tobacco, such as cigarettes, e-cigarettes, and chewing tobacco. If you need help quitting, ask your health care provider.  When there is a lot of pollen, air pollution, or humidity, keep  windows closed and use an air conditioner or go to places with air conditioning.   Asthma action plan  Work with your health care provider to make a written plan for managing and treating your asthma attacks (asthma action plan). This plan should include: ? A list of your asthma triggers and how to avoid them. ? A list of symptoms that you may have during an asthma attack. ? Information about which medicine to take, when to take the medicine and how much of the medicine to take. ? Information to help you understand your peak flow measurements. ? Daily actions that you can take to control your asthma symptoms. ? Contact information for your health care providers.  If you have an asthma attack, act quickly. Follow the emergency steps on your written asthma action plan. This may prevent you from needing to go to the hospital. General instructions  Avoid excessive exercise or activity until your asthma attack goes away. Ask your health care provider what activities are safe for you and when you can return to your normal activities.  Stay up to date on all your vaccines, such as flu and pneumonia vaccines.  Drink enough fluid to keep your urine pale yellow. Staying hydrated helps keep mucus in your lungs thin so it can be coughed up easily.  Do not use alcohol until you have recovered.  Keep all follow-up visits as told by your health care provider. This is important. Asthma requires careful medical care. Contact a health care provider if:  You have followed your action plan for 1 hour and your peak flow reading is still at 50-79%. This is in the yellow zone, which means "caution."  You need to use your quick reliever medicine more frequently than normal.  Your medicines are causing side effects, such as rash, itching, swelling, or trouble breathing.  Your symptoms do not improve after 48 hours.  You cough up mucus that is thicker than usual.  You have a fever. Get help right away  if:  Your peak flow reading is less than 50% of your personal best. This is in the red zone, which means "danger."  You have trouble breathing.  You develop chest pain or discomfort.  Your medicines no longer seem to be helping.  You are coughing up bloody mucus.  You have a fever and your symptoms suddenly get worse.  You have trouble swallowing.  You feel very tired, and breathing becomes tiring. These symptoms may represent a serious problem that is an emergency. Do not wait to see if the symptoms will go away. Get medical help right away. Call your local emergency services (911 in the [place] Do not drive yourself to the hospital. Summary  Asthma attacks are caused by narrowing or tightness in air passages, which causes shortness of breath, coughing, and loud breathing (wheezing).  Many things can trigger an asthma attack, such as allergens, weather changes, exercise, strong odors, and smoke of any kind.  If you have an asthma attack, act quickly. Follow the emergency steps on your written asthma action plan.  Get help right away if you have severe trouble breathing, chest pain, or fever, or if your home medicines are no longer helping with your symptoms.   This information is not intended to replace advice given to you by your health care provider. Make sure you discuss any questions you have with your health care provider. Document Revised: 04/16/2019 Document Reviewed: 04/16/2019 Elsevier Patient Education  2021 Elsevier Inc.  

## 2020-08-14 NOTE — Progress Notes (Signed)
Patient ID: Ruben Gonzalez, male   DOB: 09-19-1970, 50 y.o.   MRN: [id number] [facility] PROGRESS NOTE  Ruben Gonzalez [id number]:[id number] DOB: 1970/10/18 DOA: 08/13/2020 PCP: Barbette Reichmann, MD  HPI/Subjective: Patient feeling better today but still wheezing and still having some cough and a little shortness of breath.  Feels better than when he came in though.  Objective: Vitals:   08/14/20 1132 08/14/20 1348  BP: (!) 134/99   Pulse: 86 88  Resp: 16 16  Temp: 98.5 F (36.9 C)   SpO2: 97% 94%    Intake/Output Summary (Last 24 hours) at 08/14/2020 1451 Last data filed at 08/14/2020 1348 Gross per 24 hour  Intake 360 ml  Output --  Net 360 ml   Filed Weights   08/13/20 0724  Weight: 72.6 kg    ROS: Review of Systems  Respiratory: Positive for cough, shortness of breath and wheezing.   Cardiovascular: Negative for chest pain.  Gastrointestinal: Negative for abdominal pain, nausea and vomiting.   Exam: Physical Exam HENT:     Head: Normocephalic.     Mouth/Throat:     Pharynx: No oropharyngeal exudate.  Eyes:     General: Lids are normal.     Conjunctiva/sclera: Conjunctivae normal.     Pupils: Pupils are equal, round, and reactive to light.  Cardiovascular:     Rate and Rhythm: Normal rate and regular rhythm.     Heart sounds: Normal heart sounds, S1 normal and S2 normal.  Pulmonary:     Breath sounds: Examination of the right-middle field reveals decreased breath sounds and wheezing. Examination of the left-middle field reveals decreased breath sounds and wheezing. Examination of the right-lower field reveals decreased breath sounds and wheezing. Examination of the left-lower field reveals decreased breath sounds and wheezing. Decreased breath sounds and wheezing present.  Abdominal:     Palpations: Abdomen is soft.     Tenderness: There is no abdominal tenderness.  Musculoskeletal:     Right lower leg: No swelling.     Left lower leg: No swelling.   Skin:    General: Skin is warm.     Findings: No rash.  Neurological:     Mental Status: He is alert and oriented to person, place, and time.       Data Reviewed: Basic Metabolic Panel: Recent Labs  Lab 08/13/20 0731 08/13/20 1303 08/14/20 0510  NA 139  --  138  K 4.4  --  4.4  CL 102  --  103  CO2 28  --  23  GLUCOSE 86  --  115*  BUN 10  --  16  CREATININE 1.05 1.00 0.79  CALCIUM 10.1  --  9.8  MG  --   --  1.8  PHOS  --   --  4.5  CBC: Recent Labs  Lab 08/13/20 0731 08/13/20 1303 08/14/20 0510  WBC 6.1 5.6 13.3*  HGB 17.3* 17.1* 16.6  HCT 51.5 52.9* 49.8  MCV 91.3 91.5 90.5  PLT 269 300 333     Recent Results (from the past 240 hour(s))  Resp Panel by RT-PCR (Flu A&B, Covid) Nasopharyngeal Swab     Status: None   Collection Time: 08/13/20 11:37 AM   Specimen: Nasopharyngeal Swab; Nasopharyngeal(NP) swabs in vial transport medium  Result Value Ref Range Status   SARS Coronavirus 2 by RT PCR NEGATIVE NEGATIVE Final    Comment: (NOTE) SARS-CoV-2 target nucleic acids are NOT DETECTED.  The SARS-CoV-2 RNA is generally detectable in upper  respiratory specimens during the acute phase of infection. The lowest concentration of SARS-CoV-2 viral copies this assay can detect is 138 copies/mL. A negative result does not preclude SARS-Cov-2 infection and should not be used as the sole basis for treatment or other patient management decisions. A negative result may occur with  improper specimen collection/handling, submission of specimen other than nasopharyngeal swab, presence of viral mutation(s) within the areas targeted by this assay, and inadequate number of viral copies(<138 copies/mL). A negative result must be combined with clinical observations, patient history, and epidemiological information. The expected result is Negative.  Fact Sheet for Patients:  BloggerCourse.com  Fact Sheet for Healthcare Providers:   SeriousBroker.it  This test is no t yet approved or cleared by the [place] FDA and  has been authorized for detection and/or diagnosis of SARS-CoV-2 by FDA under an Emergency Use Authorization (EUA). This EUA will remain  in effect (meaning this test can be used) for the duration of the COVID-19 declaration under Section 564(b)(1) of the Act, 21 U.S.C.section 360bbb-3(b)(1), unless the authorization is terminated  or revoked sooner.       Influenza A by PCR NEGATIVE NEGATIVE Final   Influenza B by PCR NEGATIVE NEGATIVE Final    Comment: (NOTE) The Xpert Xpress SARS-CoV-2/FLU/RSV plus assay is intended as an aid in the diagnosis of influenza from Nasopharyngeal swab specimens and should not be used as a sole basis for treatment. Nasal washings and aspirates are unacceptable for Xpert Xpress SARS-CoV-2/FLU/RSV testing.  Fact Sheet for Patients: BloggerCourse.com  Fact Sheet for Healthcare Providers: SeriousBroker.it  This test is not yet approved or cleared by the [place] FDA and has been authorized for detection and/or diagnosis of SARS-CoV-2 by FDA under an Emergency Use Authorization (EUA). This EUA will remain in effect (meaning this test can be used) for the duration of the COVID-19 declaration under Section 564(b)(1) of the Act, 21 U.S.C. section 360bbb-3(b)(1), unless the authorization is terminated or revoked.  Performed at [facility], [address] [place], [place] [address]      Studies: DG Chest 2 View  Result Date: 08/13/2020 CLINICAL DATA:  Dyspnea EXAM: CHEST - 2 VIEW COMPARISON:  None. FINDINGS: The lungs are symmetrically well expanded and are clear. No pneumothorax or pleural effusion. Bronchitic changes are noted within the perihilar regions. Cardiac size within normal limits. The pulmonary vascularity is normal. No acute bone abnormality. IMPRESSION:  Perihilar bronchitic changes. No superimposed focal pulmonary infiltrate. Electronically Signed   By: Helyn Numbers MD   On: 08/13/2020 08:06    Scheduled Meds: . amLODipine  5 mg Oral Daily  . [START ON 08/15/2020] azithromycin  250 mg Oral Daily  . carvedilol  12.5 mg Oral BID  . enoxaparin (LOVENOX) injection  40 mg Subcutaneous Q24H  . fluticasone furoate-vilanterol  1 puff Inhalation Daily  . folic acid  1 mg Oral Daily  . ipratropium-albuterol  3 mL Nebulization Q6H  . irbesartan  150 mg Oral Daily  . methylPREDNISolone (SOLU-MEDROL) injection  40 mg Intravenous Q12H  . pantoprazole  40 mg Oral Daily  . thiamine  100 mg Oral Daily   Brief history patient admitted 08/13/2020 with shortness of breath.  Admitted with acute hypoxic respiratory failure and asthma exacerbation.  Patient also has a history of essential hypertension and former marijuana and alcohol abuse.  Patient states that where he was staying he was exposed to mold and unsanitary conditions.  Assessment/Plan:  1. Acute hypoxic respiratory failure secondary  to asthma exacerbation.  Pulse ox 86% on room air documented in the emergency room.  Patient ambulated today with nursing staff and held his saturations off oxygen.  This has improved. 2. Asthma exacerbation.  Patient still has wheezing and bronchospasm.  Would benefit from at least another day of IV steroids.  Chest x-ray with perihilar bronchitic changes.  Zithromax started. 3. Essential hypertension on amlodipine Coreg and irbesartan. 4. Patient states that he was in rehab for marijuana and alcohol. 5. Patient states he will go back and live with his father.  He states he is independent. 6. I did do his medications through medication management on Friday since they are closed on the weekend.  They will deliver to his room for anticipation of discharge over the weekend.      Code Status:     Code Status Orders  (From admission, onward)         Start      Ordered   08/13/20 1253  Full code  Continuous        08/13/20 1252        Code Status History    Date Active Date Inactive Code Status Order ID Comments User Context   01/31/2016 0343 01/31/2016 0822 Full Code [id number]  Ihor Austin, MD Inpatient   Advance Care Planning Activity     Family Communication: Do not have a good working phone number in the computer Disposition Plan: Status is: Inpatient  Dispo:  Patient From: Other  Planned Disposition: Home potentially tomorrow if breathing better  Medically stable for discharge: No, still requiring IV steroids.  Antibiotics:  Zithromax  Time spent: 28 minutes  Teruo [employer]

## 2020-08-14 NOTE — TOC Progression Note (Signed)
Transition of Care [facility]) - Progression Note    Patient Details  Name: Ruben Gonzalez MRN: [id number] Date of Birth: 02/23/1971  Transition of Care [facility]) CM/SW Contact  Barrie Dunker, RN Phone Number: 08/14/2020, 3:35 PM  Clinical Narrative:   Provided the patient with the medications to DC home with from [facility], they are closed over the weekend, the medications are in the patient belonging bag to go home with    Expected Discharge Plan: Home/Self Care Barriers to Discharge: Barriers Resolved  Expected Discharge Plan and Services Expected Discharge Plan: Home/Self Care   Discharge Planning Services: CM Consult,Medication Assistance,Indigent Health Clinic   Living arrangements for the past 2 months:  (substance abuse 30 day rehab)                 DME Arranged: N/A         HH Arranged: NA           Social Determinants of Health (SDOH) Interventions    Readmission Risk Interventions No flowsheet data found.

## 2020-08-15 DIAGNOSIS — J9601 Acute respiratory failure with hypoxia: Secondary | ICD-10-CM

## 2020-08-15 LAB — FOLATE: Folate: 10.8 ng/mL (ref >5.9)

## 2020-08-15 LAB — VITAMIN D 25 HYDROXY (VIT D DEFICIENCY, FRACTURES): Vit D, 25-Hydroxy: 12.66 ng/mL — ABNORMAL LOW (ref 30–100)

## 2020-08-15 LAB — VITAMIN B12: Vitamin B-12: 137 pg/mL — ABNORMAL LOW (ref 180–914)

## 2020-08-15 MED ORDER — IPRATROPIUM-ALBUTEROL 0.5-2.5 (3) MG/3ML IN SOLN
3.0000 mL | Freq: Three times a day (TID) | RESPIRATORY_TRACT | Status: DC
  Administered 2020-08-15: 3 mL via RESPIRATORY_TRACT
  Filled 2020-08-15: qty 3

## 2020-08-15 NOTE — Plan of Care (Signed)
  Problem: Education: Goal: Knowledge of General Education information will improve Description: Including pain rating scale, medication(s)/side effects and non-pharmacologic comfort measures 08/15/2020 1317 by Jules Schick, RN Outcome: Completed/Met 08/15/2020 0850 by Jules Schick, RN Outcome: Adequate for Discharge   Problem: Health Behavior/Discharge Planning: Goal: Ability to manage health-related needs will improve 08/15/2020 1317 by Jules Schick, RN Outcome: Completed/Met 08/15/2020 0850 by Jules Schick, RN Outcome: Adequate for Discharge   Problem: Clinical Measurements: Goal: Ability to maintain clinical measurements within normal limits will improve 08/15/2020 1317 by Jules Schick, RN Outcome: Completed/Met 08/15/2020 0850 by Jules Schick, RN Outcome: Adequate for Discharge Goal: Will remain free from infection 08/15/2020 1317 by Jules Schick, RN Outcome: Completed/Met 08/15/2020 0850 by Jules Schick, RN Outcome: Adequate for Discharge Goal: Diagnostic test results will improve 08/15/2020 1317 by Jules Schick, RN Outcome: Completed/Met 08/15/2020 0850 by Jules Schick, RN Outcome: Adequate for Discharge Goal: Respiratory complications will improve 08/15/2020 1317 by Jules Schick, RN Outcome: Completed/Met 08/15/2020 0850 by Jules Schick, RN Outcome: Adequate for Discharge Goal: Cardiovascular complication will be avoided 08/15/2020 1317 by Jules Schick, RN Outcome: Completed/Met 08/15/2020 0850 by Jules Schick, RN Outcome: Adequate for Discharge   Problem: Activity: Goal: Risk for activity intolerance will decrease 08/15/2020 1317 by Jules Schick, RN Outcome: Completed/Met 08/15/2020 0850 by Jules Schick, RN Outcome: Adequate for Discharge   Problem: Nutrition: Goal: Adequate nutrition will be maintained 08/15/2020 1317 by Jules Schick, RN Outcome: Completed/Met 08/15/2020 0850 by Jules Schick, RN Outcome: Adequate for Discharge   Problem: Coping: Goal: Level of anxiety will decrease 08/15/2020 1317 by Jules Schick, RN Outcome: Completed/Met 08/15/2020 0850 by Jules Schick, RN Outcome: Adequate for Discharge   Problem: Elimination: Goal: Will not experience complications related to bowel motility 08/15/2020 1317 by Jules Schick, RN Outcome: Completed/Met 08/15/2020 0850 by Jules Schick, RN Outcome: Adequate for Discharge Goal: Will not experience complications related to urinary retention 08/15/2020 1317 by Jules Schick, RN Outcome: Completed/Met 08/15/2020 0850 by Jules Schick, RN Outcome: Adequate for Discharge   Problem: Pain Managment: Goal: General experience of comfort will improve 08/15/2020 1317 by Jules Schick, RN Outcome: Completed/Met 08/15/2020 0850 by Jules Schick, RN Outcome: Adequate for Discharge   Problem: Safety: Goal: Ability to remain free from injury will improve 08/15/2020 1317 by Jules Schick, RN Outcome: Completed/Met 08/15/2020 0850 by Jules Schick, RN Outcome: Adequate for Discharge   Problem: Skin Integrity: Goal: Risk for impaired skin integrity will decrease 08/15/2020 1317 by Jules Schick, RN Outcome: Completed/Met 08/15/2020 0850 by Jules Schick, RN Outcome: Adequate for Discharge

## 2020-08-15 NOTE — Discharge Summary (Signed)
[facility] Discharge Summary   Patient: Ruben Gonzalez JOA:[id number]  PCP: Barbette Reichmann, MD  Date of admission: 08/13/2020   Date of discharge:  08/15/2020     Discharge Diagnoses:  Principal diagnosis Acute respiratory failure with hypoxia  Active Problems:   Asthma exacerbation     Essential hypertension   Admitted From: Home Disposition:  Home   Recommendations for Outpatient Follow-up:  1. PCP: in 1 wk    [facility] Information    [facility]).              Diet recommendation: Cardiac diet  Activity: The patient is advised to gradually reintroduce usual activities, as tolerated  Discharge Condition: stable  Code Status: Full code   History of present illness: As per the H and P dictated on admission Hospital Course:  Patient admitted 08/13/2020 with shortness of breath.  Admitted with acute hypoxic respiratory failure and asthma exacerbation.  Patient also has a history of essential hypertension and former marijuana and alcohol abuse.  Patient states that where he was staying he was exposed to mold and unsanitary conditions.  He was found to have acute hyper respiratory failure, asthma exacerbation so patient was admitted for further management as below. # Acute hypoxic respiratory failure secondary to asthma exacerbation.  Pulse ox 86% on room air documented in the emergency room.  Patient ambulated today with nursing staff and held his saturations off oxygen.  Hypoxia resolved, patient is saturating well on room air. # Asthma exacerbation.  Patient was given IV steroids, wheezing improved.  Patient was also given azithromycin for prophylaxis, possibility of atypical infection. Chest x-ray with perihilar bronchitic changes.  Azithromycin was prescribed for 3 additional days and outpatient # Essential hypertension on amlodipine Coreg and irbesartan.  Patient was advised to monitor BP and follow with PCP for further management of  hypertension #Patient states that he was in rehab for marijuana and alcohol. #  Patient states he will go back and live with his father.  He states he is independent. All medications were arranged through medication management on Friday before I started my service.  Patient was stable and agreed with the discharge planning so patient was cleared for discharge home and follow-up recommended with PCP.    Body mass index is 23.63 kg/m.  Nutrition Interventions:   Patient was ambulatory without any assistance. On the day of the discharge the patient's vitals were stable, and no other acute medical condition were reported by patient. the patient was felt safe to be discharge at Home .  Consultants: None Procedures: None  Discharge Exam: General: Appear in no distress, no Rash; Oral Mucosa Clear, moist. Cardiovascular: S1 and S2 Present, no Murmur, Respiratory: normal respiratory effort, Bilateral Air entry present and no Crackles, no wheezes Abdomen: Bowel Sound present, Soft and no tenderness, no hernia Extremities: no Pedal edema, no calf tenderness Neurology: alert and oriented to time, place, and person affect appropriate.  Filed Weights   08/13/20 0724  Weight: 72.6 kg   Vitals:   08/15/20 0818 08/15/20 0830  BP:  (!) 154/116  Pulse: 80 89  Resp: 16 16  Temp:  98.6 F (37 C)  SpO2: 96% 97%    DISCHARGE MEDICATION: Allergies as of 08/15/2020   No Known Allergies     Medication List    STOP taking these medications   amLODipine-valsartan 5-160 MG tablet Commonly known as: EXFORGE   chlordiazePOXIDE 25 MG capsule Commonly known as:  LIBRIUM   erythromycin ophthalmic ointment   folic acid 1 MG tablet Commonly known as: FOLVITE   lisinopril-hydrochlorothiazide 20-25 MG tablet Commonly known as: ZESTORETIC   ProAir HFA 108 (90 Base) MCG/ACT inhaler Generic drug: albuterol Replaced by: albuterol 1.25 MG/3ML nebulizer solution   thiamine 100 MG tablet    valsartan 160 MG tablet Commonly known as: DIOVAN   Viagra 100 MG tablet Generic drug: sildenafil     TAKE these medications   albuterol 108 (90 Base) MCG/ACT inhaler Commonly known as: VENTOLIN HFA Inhale 2 puffs into the lungs every 6 (six) hours as needed for wheezing or shortness of breath.   albuterol 1.25 MG/3ML nebulizer solution Commonly known as: ACCUNEB Inhale by nebulization 6 mLs (2.5 mg total) every 4 (four) hours as needed for wheezing or shortness of breath. Replaces: ProAir HFA 108 (90 Base) MCG/ACT inhaler   amLODipine 5 MG tablet Commonly known as: NORVASC Take 1 tablet (5 mg total) by mouth daily.   azithromycin 250 MG tablet Commonly known as: Zithromax TAKE 1 (ONE) TABLET BY MOUTH DAILY FOR 3 DAYS.   Breo Ellipta 100-25 MCG/INH Aepb Generic drug: fluticasone furoate-vilanterol Inhale 1 puff into the lungs daily.   carvedilol 12.5 MG tablet Commonly known as: COREG Take 1 tablet (12.5 mg total) by mouth 2 (two) times daily.   irbesartan 150 MG tablet Commonly known as: AVAPRO Take 1 tablet (150 mg total) by mouth daily.   predniSONE 20 MG tablet Commonly known as: DELTASONE Take 2 tablets (40 mg total) by mouth daily for 2 days.      No Known Allergies Discharge Instructions    Call MD for:  difficulty breathing, headache or visual disturbances   Complete by: As directed    Call MD for:  temperature >100.4   Complete by: As directed    Diet - low sodium heart healthy   Complete by: As directed    Discharge instructions   Complete by: As directed    Follow-up with PCP in 1 week, monitor BP at home.   Increase activity slowly   Complete by: As directed       The results of significant diagnostics from this hospitalization (including imaging, microbiology, ancillary and laboratory) are listed below for reference.    Significant Diagnostic Studies: DG Chest 2 View  Result Date: 08/13/2020 CLINICAL DATA:  Dyspnea EXAM: CHEST - 2 VIEW  COMPARISON:  None. FINDINGS: The lungs are symmetrically well expanded and are clear. No pneumothorax or pleural effusion. Bronchitic changes are noted within the perihilar regions. Cardiac size within normal limits. The pulmonary vascularity is normal. No acute bone abnormality. IMPRESSION: Perihilar bronchitic changes. No superimposed focal pulmonary infiltrate. Electronically Signed   By: Helyn Numbers MD   On: 08/13/2020 08:06    Microbiology: Recent Results (from the past 240 hour(s))  Resp Panel by RT-PCR (Flu A&B, Covid) Nasopharyngeal Swab     Status: None   Collection Time: 08/13/20 11:37 AM   Specimen: Nasopharyngeal Swab; Nasopharyngeal(NP) swabs in vial transport medium  Result Value Ref Range Status   SARS Coronavirus 2 by RT PCR NEGATIVE NEGATIVE Final    Comment: (NOTE) SARS-CoV-2 target nucleic acids are NOT DETECTED.  The SARS-CoV-2 RNA is generally detectable in upper respiratory specimens during the acute phase of infection. The lowest concentration of SARS-CoV-2 viral copies this assay can detect is 138 copies/mL. A negative result does not preclude SARS-Cov-2 infection and should not be used as the sole basis for treatment or  other patient management decisions. A negative result may occur with  improper specimen collection/handling, submission of specimen other than nasopharyngeal swab, presence of viral mutation(s) within the areas targeted by this assay, and inadequate number of viral copies(<138 copies/mL). A negative result must be combined with clinical observations, patient history, and epidemiological information. The expected result is Negative.  Fact Sheet for Patients:  BloggerCourse.com  Fact Sheet for Healthcare Providers:  SeriousBroker.it  This test is no t yet approved or cleared by the [place] FDA and  has been authorized for detection and/or diagnosis of SARS-CoV-2 by FDA under an Emergency  Use Authorization (EUA). This EUA will remain  in effect (meaning this test can be used) for the duration of the COVID-19 declaration under Section 564(b)(1) of the Act, 21 U.S.C.section 360bbb-3(b)(1), unless the authorization is terminated  or revoked sooner.       Influenza A by PCR NEGATIVE NEGATIVE Final   Influenza B by PCR NEGATIVE NEGATIVE Final    Comment: (NOTE) The Xpert Xpress SARS-CoV-2/FLU/RSV plus assay is intended as an aid in the diagnosis of influenza from Nasopharyngeal swab specimens and should not be used as a sole basis for treatment. Nasal washings and aspirates are unacceptable for Xpert Xpress SARS-CoV-2/FLU/RSV testing.  Fact Sheet for Patients: BloggerCourse.com  Fact Sheet for Healthcare Providers: SeriousBroker.it  This test is not yet approved or cleared by the [place] FDA and has been authorized for detection and/or diagnosis of SARS-CoV-2 by FDA under an Emergency Use Authorization (EUA). This EUA will remain in effect (meaning this test can be used) for the duration of the COVID-19 declaration under Section 564(b)(1) of the Act, 21 U.S.C. section 360bbb-3(b)(1), unless the authorization is terminated or revoked.  Performed at [facility], [address] [place], [place] [address]      Labs: CBC: Recent Labs  Lab 08/13/20 0731 08/13/20 1303 08/14/20 0510  WBC 6.1 5.6 13.3*  HGB 17.3* 17.1* 16.6  HCT 51.5 52.9* 49.8  MCV 91.3 91.5 90.5  PLT 269 300 333   Basic Metabolic Panel: Recent Labs  Lab 08/13/20 0731 08/13/20 1303 08/14/20 0510  NA 139  --  138  K 4.4  --  4.4  CL 102  --  103  CO2 28  --  23  GLUCOSE 86  --  115*  BUN 10  --  16  CREATININE 1.05 1.00 0.79  CALCIUM 10.1  --  9.8  MG  --   --  1.8  PHOS  --   --  4.5   Liver Function Tests: No results for input(s): AST, ALT, ALKPHOS, BILITOT, PROT, ALBUMIN in the last 168 hours. No results for  input(s): LIPASE, AMYLASE in the last 168 hours. No results for input(s): AMMONIA in the last 168 hours. Cardiac Enzymes: No results for input(s): CKTOTAL, CKMB, CKMBINDEX, TROPONINI in the last 168 hours. BNP (last 3 results) No results for input(s): BNP in the last 8760 hours. CBG: No results for input(s): GLUCAP in the last 168 hours.  Time spent: 35 minutes  Signed:  Gillis Santa  [facility]  08/15/2020 12:11 PM

## 2020-08-15 NOTE — Progress Notes (Signed)
Pt provided discharge instructions and all questions were answered. Pts VSS and listed below. Pts mode of transport at discharge is ambulating with steady gait. All belonging returned.   08/15/20 1647  Vitals  Temp 98.6 F (37 C)  BP (!) 157/111  MAP (mmHg) 129  BP Location Left Arm  BP Method Automatic  Patient Position (if appropriate) Sitting  Pulse Rate 78  Pulse Rate Source Monitor  Resp 18  MEWS COLOR  MEWS Score Color Green  Oxygen Therapy  SpO2 97 %  O2 Device Room Air  Patient Activity (if Appropriate) Ambulating

## 2020-08-15 NOTE — Plan of Care (Signed)

## 2020-08-17 ENCOUNTER — Other Ambulatory Visit: Payer: Self-pay

## 2020-08-23 ENCOUNTER — Emergency Department
Admission: EM | Admit: 2020-08-23 | Discharge: 2020-08-24 | Disposition: A | Discharge status: Home / Self Care | Payer: Self-pay | Attending: Emergency Medicine | Admitting: Emergency Medicine

## 2020-08-23 ENCOUNTER — Emergency Department: Payer: Self-pay

## 2020-08-23 DIAGNOSIS — J45901 Unspecified asthma with (acute) exacerbation: Secondary | ICD-10-CM | POA: Insufficient documentation

## 2020-08-23 DIAGNOSIS — Z79899 Other long term (current) drug therapy: Secondary | ICD-10-CM | POA: Insufficient documentation

## 2020-08-23 DIAGNOSIS — R0602 Shortness of breath: Secondary | ICD-10-CM | POA: Insufficient documentation

## 2020-08-23 DIAGNOSIS — I10 Essential (primary) hypertension: Secondary | ICD-10-CM | POA: Insufficient documentation

## 2020-08-23 LAB — CBC
HCT: 47.5 % (ref 39.0–52.0)
Hemoglobin: 15.9 g/dL (ref 13.0–17.0)
MCH: 30.6 pg (ref 26.0–34.0)
MCHC: 33.5 g/dL (ref 30.0–36.0)
MCV: 91.5 fL (ref 80.0–100.0)
Platelets: 229 10*3/uL (ref 150–400)
RBC: 5.19 MIL/uL (ref 4.22–5.81)
RDW: 12.9 % (ref 11.5–15.5)
WBC: 6.2 10*3/uL (ref 4.0–10.5)
nRBC: 0 % (ref 0.0–0.2)

## 2020-08-23 LAB — COMPREHENSIVE METABOLIC PANEL
ALT: 17 U/L (ref 0–44)
AST: 21 U/L (ref 15–41)
Albumin: 3.9 g/dL (ref 3.5–5.0)
Alkaline Phosphatase: 56 U/L (ref 38–126)
Anion gap: 8 (ref 5–15)
BUN: 12 mg/dL (ref 6–20)
CO2: 31 mmol/L (ref 22–32)
Calcium: 9 mg/dL (ref 8.9–10.3)
Chloride: 102 mmol/L (ref 98–111)
Creatinine, Ser: 1.02 mg/dL (ref 0.61–1.24)
GFR, Estimated: >60 mL/min (ref >60)
Glucose, Bld: 96 mg/dL (ref 70–99)
Potassium: 4.1 mmol/L (ref 3.5–5.1)
Sodium: 141 mmol/L (ref 135–145)
Total Bilirubin: 0.6 mg/dL (ref 0.3–1.2)
Total Protein: 7 g/dL (ref 6.5–8.1)

## 2020-08-23 MED ORDER — METHYLPREDNISOLONE SODIUM SUCC 125 MG IJ SOLR
125.0000 mg | Freq: Once | INTRAMUSCULAR | Status: AC
  Administered 2020-08-23: 125 mg via INTRAVENOUS
  Filled 2020-08-23: qty 2

## 2020-08-23 MED ORDER — IPRATROPIUM-ALBUTEROL 0.5-2.5 (3) MG/3ML IN SOLN
3.0000 mL | Freq: Once | RESPIRATORY_TRACT | Status: AC
  Administered 2020-08-23: 3 mL via RESPIRATORY_TRACT
  Filled 2020-08-23: qty 3

## 2020-08-23 MED ORDER — PREDNISONE 10 MG PO TABS
50.0000 mg | ORAL_TABLET | Freq: Every day | ORAL | 0 refills | Status: DC
  Filled 2020-08-23: qty 25, 5d supply, fill #0

## 2020-08-23 NOTE — Discharge Instructions (Addendum)
Take steroid as prescribed.  Return to the ER for worsening symptoms, persistent vomiting, difficulty breathing or other concerns

## 2020-08-23 NOTE — ED Provider Notes (Addendum)
[facility] Emergency Department Provider Note   ____________________________________________    I have reviewed the triage vital signs and the nursing notes.   HISTORY  Chief Complaint Shortness of Breath     HPI Ruben Gonzalez is a 50 y.o. male with history of bronchospasm, who presents with complaints of shortness of breath.  Patient reports he was feeling better after being discharged from the hospital last week but today started feeling short of breath again.  He denies fevers or chills.  No nausea vomiting or diaphoresis.  No leg pain or swelling.  Review of records and straight the patient was admitted to the hospital for hypoxia related to bronchospasm on the 14th  Past Medical History:  Diagnosis Date  . Asthma   . Hypertension     Patient Active Problem List   Diagnosis Date Noted  . Acute respiratory failure with hypoxia (HCC)   . Essential hypertension   . Asthma exacerbation 08/13/2020  . Unresponsive episode 01/31/2016  . Acute renal failure (HCC) 01/31/2016  . Hypokalemia 01/31/2016  . Alcohol abuse 01/31/2016  . Marijuana abuse 01/31/2016  . Hyperglycemia 01/31/2016  . Carotid artery stenosis 01/31/2016  . Hypotension 01/31/2016  . Dehydration 01/31/2016    Past Surgical History:  Procedure Laterality Date  . VASECTOMY      Prior to Admission medications   Medication Sig Start Date End Date Taking? Authorizing Provider  predniSONE (DELTASONE) 50 MG tablet Take 1 tablet (50 mg total) by mouth daily with breakfast. 08/23/20  Yes Jene Every, MD  albuterol (ACCUNEB) 1.25 MG/3ML nebulizer solution Inhale by nebulization 6 mLs (2.5 mg total) every 4 (four) hours as needed for wheezing or shortness of breath. 08/14/20 09/28/20  Alford Highland, MD  albuterol (VENTOLIN HFA) 108 (90 Base) MCG/ACT inhaler Inhale 2 puffs into the lungs every 6 (six) hours as needed for wheezing or shortness of breath. 08/14/20   Alford Highland, MD  amLODipine (NORVASC) 5 MG tablet Take 1 tablet (5 mg total) by mouth daily. 08/15/20   Alford Highland, MD  azithromycin (ZITHROMAX) 250 MG tablet TAKE 1 (ONE) TABLET BY MOUTH DAILY FOR 3 DAYS. 08/14/20   Wieting, Richard, MD  BREO ELLIPTA 100-25 MCG/INH AEPB Inhale 1 puff into the lungs daily. 08/14/20   Alford Highland, MD  carvedilol (COREG) 12.5 MG tablet Take 1 tablet (12.5 mg total) by mouth 2 (two) times daily. 08/14/20   Alford Highland, MD  irbesartan (AVAPRO) 150 MG tablet Take 1 tablet (150 mg total) by mouth daily. 08/14/20   Alford Highland, MD     Allergies Patient has no known allergies.  Family History  Problem Relation Age of Onset  . Heart attack Mother     Social History Social History   Tobacco Use  . Smoking status: Never Smoker  . Smokeless tobacco: Never Used  Vaping Use  . Vaping Use: Never used  Substance Use Topics  . Alcohol use: Yes  . Drug use: Yes    Types: Marijuana    Review of Systems  Constitutional: No fever/chills Eyes: No visual changes.  ENT: No sore throat. Cardiovascular: Denies chest pain. Respiratory: As above, positive cough Gastrointestinal: No abdominal pain.  No nausea, no vomiting.   Genitourinary: Negative for dysuria. Musculoskeletal: Negative for back pain. Skin: Negative for rash. Neurological: Negative for headaches   ____________________________________________   PHYSICAL EXAM:  VITAL SIGNS: ED Triage Vitals  Enc Vitals Group     BP --  Pulse Rate 08/23/20 2124 99     Resp 08/23/20 2124 13     Temp 08/23/20 2123 (!) 97.5 F (36.4 C)     Temp Source 08/23/20 2123 Oral     SpO2 08/23/20 2124 92 %     Weight 08/23/20 2122 72.6 kg (160 lb)     Height 08/23/20 2122 1.753 m (5\' 9" )     Head Circumference --      Peak Flow --      Pain Score 08/23/20 2122 0     Pain Loc --      Pain Edu? --      Excl. in GC? --     Constitutional: Alert and oriented.   Nose: No  congestion/rhinnorhea. Mouth/Throat: Mucous membranes are moist.    Cardiovascular: Normal rate, regular rhythm. Grossly normal heart sounds.  Good peripheral circulation. Respiratory: Normal respiratory effort.  No retractions.  Scattered wheezes Gastrointestinal: Soft and nontender. No distention.  No CVA tenderness.  Musculoskeletal: No lower extremity tenderness nor edema.  Warm and well perfused Neurologic:  Normal speech and language. No gross focal neurologic deficits are appreciated.  Skin:  Skin is warm, dry and intact. No rash noted. Psychiatric: Mood and affect are normal. Speech and behavior are normal.  ____________________________________________   LABS (all labs ordered are listed, but only abnormal results are displayed)  Labs Reviewed  CBC  COMPREHENSIVE METABOLIC PANEL   ____________________________________________  EKG  ED ECG REPORT I, 2123, the attending physician, personally viewed and interpreted this ECG.  Date: 08/23/2020  Rhythm: normal sinus rhythm QRS Axis: normal Intervals: normal ST/T Wave abnormalities: normal Narrative Interpretation: no evidence of acute ischemia  ____________________________________________  RADIOLOGY  Chest x-ray reviewed by me, no acute abnormality ____________________________________________   PROCEDURES  Procedure(s) performed: No  Procedures   Critical Care performed: No ____________________________________________   INITIAL IMPRESSION / ASSESSMENT AND PLAN / ED COURSE  Pertinent labs & imaging results that were available during my care of the patient were reviewed by me and considered in my medical decision making (see chart for details).  Patient presents with shortness of breath as detailed above, initial saturations 90% on room air.  No tachypnea.  Scattered wheezing on exam, will give IV Solu-Medrol, DuoNebs, obtain chest x-ray, labs and  reevaluate.  ----------------------------------------- 10:58 PM on 08/23/2020 -----------------------------------------  Patient reports feeling somewhat better after DuoNeb's, still pending CMP, ambulatory pulse ox, have asked my colleague to follow-up anticipate discharge      ____________________________________________   FINAL CLINICAL IMPRESSION(S) / ED DIAGNOSES  Final diagnoses:  Shortness of breath        Note:  This document was prepared using Dragon voice recognition software and may include unintentional dictation errors.   08/25/2020, MD 08/23/20 08/25/20    Dwaine Deter, MD 08/23/20 [phone]

## 2020-08-23 NOTE — ED Triage Notes (Signed)
Pt BIB EMS from home for c/o SOB. Pt states he has intermittent feelings of wheezing. Pt used home inhaler with no relief.  EMS noted clear lung sounds.  Hospitalized for asthma 1 week ago.

## 2020-08-23 NOTE — ED Notes (Signed)
Pt walked in hallway with pulse ox applied. Pt O2 started out 94% and remained there during ambulation. Pt was able to ambulate without having to stop or take deep breaths.

## 2020-08-24 ENCOUNTER — Other Ambulatory Visit: Payer: Self-pay

## 2020-08-24 NOTE — ED Provider Notes (Signed)
-----------------------------------------   12:21 AM on 08/24/2020 -----------------------------------------  Patient ambulated well with pulse ox, did not dip below 94%.  He was not tachycardic nor tachypneic on walking.  Overall feeling significantly better.  Patient will be discharged home on Prednisone per previous provider.  Strict return precautions given.  Patient verbalizes understanding and agrees with plan of care.   Irean Hong, MD 08/24/20 [phone]

## 2020-09-28 ENCOUNTER — Emergency Department
Admission: EM | Admit: 2020-09-28 | Discharge: 2020-09-29 | Disposition: A | Discharge status: Home / Self Care | Payer: Self-pay | Attending: Emergency Medicine | Admitting: Emergency Medicine

## 2020-09-28 ENCOUNTER — Emergency Department: Payer: Self-pay

## 2020-09-28 ENCOUNTER — Other Ambulatory Visit: Payer: Self-pay

## 2020-09-28 DIAGNOSIS — Z7951 Long term (current) use of inhaled steroids: Secondary | ICD-10-CM | POA: Insufficient documentation

## 2020-09-28 DIAGNOSIS — F10920 Alcohol use, unspecified with intoxication, uncomplicated: Secondary | ICD-10-CM

## 2020-09-28 DIAGNOSIS — Z20822 Contact with and (suspected) exposure to covid-19: Secondary | ICD-10-CM | POA: Insufficient documentation

## 2020-09-28 DIAGNOSIS — Y908 Blood alcohol level of 240 mg/100 ml or more: Secondary | ICD-10-CM | POA: Insufficient documentation

## 2020-09-28 DIAGNOSIS — R0602 Shortness of breath: Secondary | ICD-10-CM | POA: Insufficient documentation

## 2020-09-28 DIAGNOSIS — J45901 Unspecified asthma with (acute) exacerbation: Secondary | ICD-10-CM | POA: Insufficient documentation

## 2020-09-28 DIAGNOSIS — I10 Essential (primary) hypertension: Secondary | ICD-10-CM | POA: Insufficient documentation

## 2020-09-28 DIAGNOSIS — R079 Chest pain, unspecified: Secondary | ICD-10-CM | POA: Insufficient documentation

## 2020-09-28 DIAGNOSIS — Z79899 Other long term (current) drug therapy: Secondary | ICD-10-CM | POA: Insufficient documentation

## 2020-09-28 DIAGNOSIS — F10929 Alcohol use, unspecified with intoxication, unspecified: Secondary | ICD-10-CM | POA: Insufficient documentation

## 2020-09-28 DIAGNOSIS — E876 Hypokalemia: Secondary | ICD-10-CM | POA: Insufficient documentation

## 2020-09-28 DIAGNOSIS — R000 Tachycardia, unspecified: Secondary | ICD-10-CM | POA: Insufficient documentation

## 2020-09-28 LAB — COMPREHENSIVE METABOLIC PANEL
ALT: 16 U/L (ref 0–44)
AST: 34 U/L (ref 15–41)
Albumin: 4 g/dL (ref 3.5–5.0)
Alkaline Phosphatase: 50 U/L (ref 38–126)
Anion gap: 12 (ref 5–15)
BUN: 13 mg/dL (ref 6–20)
CO2: 25 mmol/L (ref 22–32)
Calcium: 8.4 mg/dL — ABNORMAL LOW (ref 8.9–10.3)
Chloride: 103 mmol/L (ref 98–111)
Creatinine, Ser: 1.07 mg/dL (ref 0.61–1.24)
GFR, Estimated: >60 mL/min (ref >60)
Glucose, Bld: 116 mg/dL — ABNORMAL HIGH (ref 70–99)
Potassium: 3.3 mmol/L — ABNORMAL LOW (ref 3.5–5.1)
Sodium: 140 mmol/L (ref 135–145)
Total Bilirubin: 1.3 mg/dL — ABNORMAL HIGH (ref 0.3–1.2)
Total Protein: 6.8 g/dL (ref 6.5–8.1)

## 2020-09-28 LAB — CBC WITH DIFFERENTIAL/PLATELET
Abs Immature Granulocytes: 0.02 10*3/uL (ref 0.00–0.07)
Basophils Absolute: 0 10*3/uL (ref 0.0–0.1)
Basophils Relative: 1 %
Eosinophils Absolute: 0.2 10*3/uL (ref 0.0–0.5)
Eosinophils Relative: 3 %
HCT: 47.1 % (ref 39.0–52.0)
Hemoglobin: 15.8 g/dL (ref 13.0–17.0)
Immature Granulocytes: 0 %
Lymphocytes Relative: 38 %
Lymphs Abs: 3 10*3/uL (ref 0.7–4.0)
MCH: 29.2 pg (ref 26.0–34.0)
MCHC: 33.5 g/dL (ref 30.0–36.0)
MCV: 86.9 fL (ref 80.0–100.0)
Monocytes Absolute: 0.4 10*3/uL (ref 0.1–1.0)
Monocytes Relative: 6 %
Neutro Abs: 4.2 10*3/uL (ref 1.7–7.7)
Neutrophils Relative %: 52 %
Platelets: 215 10*3/uL (ref 150–400)
RBC: 5.42 MIL/uL (ref 4.22–5.81)
RDW: 14.2 % (ref 11.5–15.5)
WBC: 7.9 10*3/uL (ref 4.0–10.5)
nRBC: 0 % (ref 0.0–0.2)

## 2020-09-28 LAB — RESP PANEL BY RT-PCR (FLU A&B, COVID) ARPGX2
Influenza A by PCR: NEGATIVE
Influenza B by PCR: NEGATIVE
SARS Coronavirus 2 by RT PCR: NEGATIVE

## 2020-09-28 LAB — CBG MONITORING, ED: Glucose-Capillary: 104 mg/dL — ABNORMAL HIGH (ref 70–99)

## 2020-09-28 LAB — TROPONIN I (HIGH SENSITIVITY)
Troponin I (High Sensitivity): 11 ng/L (ref <18)
Troponin I (High Sensitivity): 9 ng/L (ref <18)

## 2020-09-28 LAB — MAGNESIUM: Magnesium: 1.9 mg/dL (ref 1.7–2.4)

## 2020-09-28 LAB — ETHANOL: Alcohol, Ethyl (B): 409 mg/dL (ref <10)

## 2020-09-28 LAB — AMMONIA: Ammonia: 35 umol/L (ref 9–35)

## 2020-09-28 MED ORDER — LACTATED RINGERS IV BOLUS
1000.0000 mL | Freq: Once | INTRAVENOUS | Status: AC
  Administered 2020-09-28: 1000 mL via INTRAVENOUS

## 2020-09-28 MED ORDER — POTASSIUM CHLORIDE CRYS ER 20 MEQ PO TBCR
80.0000 meq | EXTENDED_RELEASE_TABLET | Freq: Once | ORAL | Status: AC
  Administered 2020-09-28: 80 meq via ORAL
  Filled 2020-09-28: qty 4

## 2020-09-28 NOTE — ED Provider Notes (Signed)
[facility] Emergency Department Provider Note  ____________________________________________   Event Date/Time   First MD Initiated Contact with Patient 09/28/20 1953     (approximate)  I have reviewed the triage vital signs and the nursing notes.   HISTORY  Chief Complaint Chest Pain   HPI Ruben Gonzalez is a 50 y.o. male with a past medical history of asthma, HTN, carotid artery stenosis and EtOH abuse who presents via EMS for assessment of some shortness of breath and chest pain in the setting of drinking from alcohol earlier today.  Patient endorses some chest pain but is able to further clarify or describe it states it went away after got symmetric with EMS.  He reportedly received 4 assays and 3 sublingual nitros.  He appears extremely intoxicated on arrival and states he is in [place] and the year is 54 although on repeat questioning with some tactile stimulation he states he is in [facility] and year is 2022.  Lower used to intoxicated on arrival to provide any additional history.         Past Medical History:  Diagnosis Date  . Asthma   . Hypertension     Patient Active Problem List   Diagnosis Date Noted  . Acute respiratory failure with hypoxia (HCC)   . Essential hypertension   . Asthma exacerbation 08/13/2020  . Unresponsive episode 01/31/2016  . Acute renal failure (HCC) 01/31/2016  . Hypokalemia 01/31/2016  . Alcohol abuse 01/31/2016  . Marijuana abuse 01/31/2016  . Hyperglycemia 01/31/2016  . Carotid artery stenosis 01/31/2016  . Hypotension 01/31/2016  . Dehydration 01/31/2016    Past Surgical History:  Procedure Laterality Date  . VASECTOMY      Prior to Admission medications   Medication Sig Start Date End Date Taking? Authorizing Provider  albuterol (ACCUNEB) 1.25 MG/3ML nebulizer solution Inhale by nebulization 6 mLs (2.5 mg total) every 4 (four) hours as needed for wheezing or shortness of  breath. 08/14/20 09/28/20  Alford Highland, MD  albuterol (VENTOLIN HFA) 108 (90 Base) MCG/ACT inhaler Inhale 2 puffs into the lungs every 6 (six) hours as needed for wheezing or shortness of breath. 08/14/20   Alford Highland, MD  amLODipine (NORVASC) 5 MG tablet Take 1 tablet (5 mg total) by mouth daily. 08/15/20   Alford Highland, MD  azithromycin (ZITHROMAX) 250 MG tablet TAKE 1 (ONE) TABLET BY MOUTH DAILY FOR 3 DAYS. 08/14/20   Wieting, Richard, MD  BREO ELLIPTA 100-25 MCG/INH AEPB Inhale 1 puff into the lungs daily. 08/14/20   Alford Highland, MD  carvedilol (COREG) 12.5 MG tablet Take 1 tablet (12.5 mg total) by mouth 2 (two) times daily. 08/14/20   Alford Highland, MD  irbesartan (AVAPRO) 150 MG tablet Take 1 tablet (150 mg total) by mouth daily. 08/14/20   Alford Highland, MD  predniSONE (DELTASONE) 10 MG tablet Take 5 tablets (50 mg total) by mouth daily with breakfast. 08/23/20   Jene Every, MD    Allergies Patient has no known allergies.  Family History  Problem Relation Age of Onset  . Heart attack Mother     Social History Social History   Tobacco Use  . Smoking status: Never Smoker  . Smokeless tobacco: Never Used  Vaping Use  . Vaping Use: Never used  Substance Use Topics  . Alcohol use: Yes  . Drug use: Yes    Types: Marijuana    Review of Systems  Review of Systems  Unable to perform ROS: Mental status  change  Respiratory: Positive for shortness of breath.   Cardiovascular: Positive for chest pain.      ____________________________________________   PHYSICAL EXAM:  VITAL SIGNS: ED Triage Vitals  Enc Vitals Group     BP      Pulse      Resp      Temp      Temp src      SpO2      Weight      Height      Head Circumference      Peak Flow      Pain Score      Pain Loc      Pain Edu?      Excl. in GC?    Vitals:   09/28/20 2230 09/28/20 2300  BP: (!) 119/91 (!) 121/96  Pulse: 75 83  Resp: 14 15  Temp:    SpO2: 96% 94%   Physical  Exam Vitals and nursing note reviewed.  Constitutional:      Appearance: He is well-developed.  HENT:     Head: Normocephalic and atraumatic.     Right Ear: External ear normal.     Left Ear: External ear normal.     Nose: Nose normal.  Eyes:     Conjunctiva/sclera: Conjunctivae normal.  Cardiovascular:     Rate and Rhythm: Regular rhythm. Tachycardia present.     Heart sounds: No murmur heard.   Pulmonary:     Effort: Pulmonary effort is normal. No respiratory distress.     Breath sounds: Normal breath sounds.  Abdominal:     Palpations: Abdomen is soft.     Tenderness: There is no abdominal tenderness.  Musculoskeletal:     Cervical back: Neck supple.     Right lower leg: No edema.     Left lower leg: No edema.  Skin:    General: Skin is warm and dry.     Capillary Refill: Capillary refill takes less than 2 seconds.  Neurological:     General: No focal deficit present.     Mental Status: He is alert.  Psychiatric:        Mood and Affect: Mood normal.        Speech: Speech is slurred.      ____________________________________________   LABS (all labs ordered are listed, but only abnormal results are displayed)  Labs Reviewed  COMPREHENSIVE METABOLIC PANEL - Abnormal; Notable for the following components:      Result Value   Potassium 3.3 (*)    Glucose, Bld 116 (*)    Calcium 8.4 (*)    Total Bilirubin 1.3 (*)    All other components within normal limits  ETHANOL - Abnormal; Notable for the following components:   Alcohol, Ethyl (B) 409 (*)    All other components within normal limits  CBG MONITORING, ED - Abnormal; Notable for the following components:   Glucose-Capillary 104 (*)    All other components within normal limits  RESP PANEL BY RT-PCR (FLU A&B, COVID) ARPGX2  CBC WITH DIFFERENTIAL/PLATELET  MAGNESIUM  AMMONIA  TROPONIN I (HIGH SENSITIVITY)  TROPONIN I (HIGH SENSITIVITY)   ____________________________________________  EKG  Sinus rhythm  tachycardia with ventricular rate of 112, normal axis, unremarkable intervals Nonspecific ST change versus artifact in V2 and V3 without other clear evidence of acute ischemia or significant underlying arrhythmia. ____________________________________________  RADIOLOGY  ED MD interpretation: No focal consolidation, large effusion, significant hemopneumothorax any other clear acute process on chest x-ray.  CT head and C-spine are unremarkable for any acute process.  Official radiology report(s): DG Chest 2 View  Result Date: 09/28/2020 CLINICAL DATA:  Chest pain EXAM: CHEST - 2 VIEW COMPARISON:  08/23/2020 FINDINGS: Cardiac shadow is stable. Lungs are well aerated bilaterally. No focal infiltrate or effusion is seen. No bony abnormality is noted. IMPRESSION: No acute abnormality seen. Electronically Signed   By: Alcide Clever M.D.   On: 09/28/2020 20:10   CT Head Wo Contrast  Result Date: 09/28/2020 CLINICAL DATA:  Intoxication, altered mental status EXAM: CT HEAD WITHOUT CONTRAST CT CERVICAL SPINE WITHOUT CONTRAST TECHNIQUE: Multidetector CT imaging of the head and cervical spine was performed following the standard protocol without intravenous contrast. Multiplanar CT image reconstructions of the cervical spine were also generated. COMPARISON:  02/17/2019 FINDINGS: CT HEAD FINDINGS Brain: No evidence of acute infarction, hemorrhage, hydrocephalus, extra-axial collection or mass lesion/mass effect. Vascular: No hyperdense vessel or unexpected calcification. Skull: Normal. Negative for fracture or focal lesion. Sinuses/Orbits: No acute finding. Other: None. CT CERVICAL SPINE FINDINGS Alignment: Normal. Skull base and vertebrae: No acute fracture. No primary bone lesion or focal pathologic process. Soft tissues and spinal canal: No prevertebral fluid or swelling. No visible canal hematoma. Disc levels: Focally moderate disc space height loss and osteophytosis C6-C7. Upper chest: Negative. Other: None.  IMPRESSION: 1. No acute intracranial pathology. 2. No fracture or static subluxation of the cervical spine. Electronically Signed   By: Lauralyn Primes M.D.   On: 09/28/2020 20:48   CT Cervical Spine Wo Contrast  Result Date: 09/28/2020 CLINICAL DATA:  Intoxication, altered mental status EXAM: CT HEAD WITHOUT CONTRAST CT CERVICAL SPINE WITHOUT CONTRAST TECHNIQUE: Multidetector CT imaging of the head and cervical spine was performed following the standard protocol without intravenous contrast. Multiplanar CT image reconstructions of the cervical spine were also generated. COMPARISON:  02/17/2019 FINDINGS: CT HEAD FINDINGS Brain: No evidence of acute infarction, hemorrhage, hydrocephalus, extra-axial collection or mass lesion/mass effect. Vascular: No hyperdense vessel or unexpected calcification. Skull: Normal. Negative for fracture or focal lesion. Sinuses/Orbits: No acute finding. Other: None. CT CERVICAL SPINE FINDINGS Alignment: Normal. Skull base and vertebrae: No acute fracture. No primary bone lesion or focal pathologic process. Soft tissues and spinal canal: No prevertebral fluid or swelling. No visible canal hematoma. Disc levels: Focally moderate disc space height loss and osteophytosis C6-C7. Upper chest: Negative. Other: None. IMPRESSION: 1. No acute intracranial pathology. 2. No fracture or static subluxation of the cervical spine. Electronically Signed   By: Lauralyn Primes M.D.   On: 09/28/2020 20:48    ____________________________________________   PROCEDURES  Procedure(s) performed (including Critical Care):  Procedures   ____________________________________________   INITIAL IMPRESSION / ASSESSMENT AND PLAN / ED COURSE      Patient presents with above-stated history exam for assessment of reported chest pain shortness of breath.  He is extremely intoxicated on arrival and unable provide any additional history secondary to apparent intoxication.  He is afebrile hemodynamically  stable on arrival.  He did receive ASA and sublingual nitro as with EMS with reported improvement of chest pain.  Given he is unable to describe events leading to emergency room concern for possible fall CT head and C-spine obtained.  These are unremarkable.  ECG shows tachycardia with some nonspecific changes but no other significant arrhythmia.  Given troponins x2 are not elevated I have a low suspicion for ACS or myocarditis.  Chest x-ray shows no evidence of pneumonia pneumothorax, effusion, edema or any other clear acute  intrathoracic process.  COVID and flu is negative.  CBC shows no leukocytosis or acute anemia.  CMP shows a K of 3.3 which was repleted without any other significant electrolyte or metabolic derangements.  Magnesium and ammonia unremarkable. Patient denies any chest pain on several reassessments with stable vitals otherwise reassuring exam and initial work-up I have a low suspicion for PE or dissection.  I think he is currently medically cleared at time of signout 2300 will require further observation to metabolize his alcohol.  Serum ethanol is elevated at 409.  Care patient signed over to oncoming fire at approximately 2300.  Plan is to allow patient additional time to metabolize and reassess.  ____________________________________________   FINAL CLINICAL IMPRESSION(S) / ED DIAGNOSES  Final diagnoses:  Chest pain, unspecified type  Hypokalemia  Alcoholic intoxication without complication (HCC)    Medications  lactated ringers bolus 1,000 mL (0 mLs Intravenous Stopped 09/28/20 2208)  potassium chloride SA (KLOR-CON) CR tablet 80 mEq (80 mEq Oral Given 09/28/20 2101)     ED Discharge Orders    None       Note:  This document was prepared using Dragon voice recognition software and may include unintentional dictation errors.   Gilles ChiquitoSmith, Isabell Bonafede P, MD 09/28/20 [phone]

## 2020-09-28 NOTE — ED Notes (Signed)
Patient transported to CT 

## 2020-09-28 NOTE — ED Triage Notes (Signed)
Pt to ED via [facility] with C/o substernal chest pain that began 1 hr PTA after being outside. EMS states pt has 3 beers and some liquor today. Pt appears intoxicated. Pt has hx of asthma. EMS states he has used his albuterol inhaler several times today. Denies nausea, vomiting. 4 aspirin and 3 nitro given en route by EMS. 18 g LAC with of fluid given. Pt alert to voice but states he is in [place] and it is 2.

## 2020-09-29 NOTE — ED Provider Notes (Signed)
Procedures     ----------------------------------------- 10:25 AM on 09/29/2020 ----------------------------------------- Patient is awake and alert, lucid.  Ambulates with steady gait, clear speech.  Tolerating p.o.  Normal vital signs.  He is clinically sober and stable for discharge.  Denies any complaints at this time.     Sharman Cheek, MD 09/29/20 1025

## 2020-09-29 NOTE — ED Notes (Signed)
Pt steady gait to 19H

## 2020-09-29 NOTE — ED Notes (Signed)
E-signature not working at this time. Pt verbalized understanding of D/C instructions, prescriptions and follow up care with no further questions at this time. Pt in NAD and ambulatory at time of D/C. Pt waiting on ride to get here at this time.

## 2020-09-29 NOTE — ED Notes (Signed)
Pt ambulated to bathroom without assistance 

## 2020-11-26 ENCOUNTER — Other Ambulatory Visit: Payer: Self-pay

## 2020-11-30 ENCOUNTER — Telehealth: Payer: Self-pay | Admitting: Pharmacist

## 2020-11-30 NOTE — Telephone Encounter (Signed)
Patient failed to provide requested 2022 financial documentation. No additional medication assistance will be provided by [facility] without the required proof of income documentation. Patient notified by letter.  Vonda [facility] Administrative Assistant 

## 2020-12-01 ENCOUNTER — Other Ambulatory Visit: Payer: Self-pay

## 2021-08-07 ENCOUNTER — Emergency Department: Payer: 59

## 2021-08-07 ENCOUNTER — Other Ambulatory Visit: Payer: Self-pay

## 2021-08-07 ENCOUNTER — Inpatient Hospital Stay
Admission: EM | Admit: 2021-08-07 | Discharge: 2021-08-10 | DRG: 202 | Disposition: A | Discharge status: Home / Self Care | Payer: 59 | Attending: Internal Medicine | Admitting: Internal Medicine

## 2021-08-07 DIAGNOSIS — Z6826 Body mass index (BMI) 26.0-26.9, adult: Secondary | ICD-10-CM

## 2021-08-07 DIAGNOSIS — T380X5A Adverse effect of glucocorticoids and synthetic analogues, initial encounter: Secondary | ICD-10-CM | POA: Diagnosis present

## 2021-08-07 DIAGNOSIS — I5033 Acute on chronic diastolic (congestive) heart failure: Secondary | ICD-10-CM | POA: Diagnosis present

## 2021-08-07 DIAGNOSIS — J45901 Unspecified asthma with (acute) exacerbation: Principal | ICD-10-CM | POA: Diagnosis present

## 2021-08-07 DIAGNOSIS — E663 Overweight: Secondary | ICD-10-CM | POA: Diagnosis present

## 2021-08-07 DIAGNOSIS — I160 Hypertensive urgency: Secondary | ICD-10-CM | POA: Diagnosis present

## 2021-08-07 DIAGNOSIS — I10 Essential (primary) hypertension: Secondary | ICD-10-CM | POA: Diagnosis present

## 2021-08-07 DIAGNOSIS — R739 Hyperglycemia, unspecified: Secondary | ICD-10-CM | POA: Diagnosis present

## 2021-08-07 DIAGNOSIS — D751 Secondary polycythemia: Secondary | ICD-10-CM | POA: Diagnosis present

## 2021-08-07 DIAGNOSIS — I110 Hypertensive heart disease with heart failure: Secondary | ICD-10-CM | POA: Diagnosis present

## 2021-08-07 DIAGNOSIS — I5032 Chronic diastolic (congestive) heart failure: Secondary | ICD-10-CM | POA: Diagnosis present

## 2021-08-07 DIAGNOSIS — Z8249 Family history of ischemic heart disease and other diseases of the circulatory system: Secondary | ICD-10-CM

## 2021-08-07 DIAGNOSIS — J9601 Acute respiratory failure with hypoxia: Secondary | ICD-10-CM | POA: Diagnosis present

## 2021-08-07 DIAGNOSIS — R000 Tachycardia, unspecified: Secondary | ICD-10-CM | POA: Diagnosis present

## 2021-08-07 LAB — CBC WITH DIFFERENTIAL/PLATELET
Abs Immature Granulocytes: 0.02 10*3/uL (ref 0.00–0.07)
Basophils Absolute: 0.1 10*3/uL (ref 0.0–0.1)
Basophils Relative: 1 %
Eosinophils Absolute: 0.8 10*3/uL — ABNORMAL HIGH (ref 0.0–0.5)
Eosinophils Relative: 10 %
HCT: 56.3 % — ABNORMAL HIGH (ref 39.0–52.0)
Hemoglobin: 18.2 g/dL — ABNORMAL HIGH (ref 13.0–17.0)
Immature Granulocytes: 0 %
Lymphocytes Relative: 16 %
Lymphs Abs: 1.3 10*3/uL (ref 0.7–4.0)
MCH: 28.4 pg (ref 26.0–34.0)
MCHC: 32.3 g/dL (ref 30.0–36.0)
MCV: 88 fL (ref 80.0–100.0)
Monocytes Absolute: 0.7 10*3/uL (ref 0.1–1.0)
Monocytes Relative: 9 %
Neutro Abs: 5.2 10*3/uL (ref 1.7–7.7)
Neutrophils Relative %: 64 %
Platelets: 185 10*3/uL (ref 150–400)
RBC: 6.4 MIL/uL — ABNORMAL HIGH (ref 4.22–5.81)
RDW: 15.3 % (ref 11.5–15.5)
WBC: 8 10*3/uL (ref 4.0–10.5)
nRBC: 0 % (ref 0.0–0.2)

## 2021-08-07 LAB — BASIC METABOLIC PANEL
Anion gap: 9 (ref 5–15)
BUN: 13 mg/dL (ref 6–20)
CO2: 29 mmol/L (ref 22–32)
Calcium: 9.7 mg/dL (ref 8.9–10.3)
Chloride: 100 mmol/L (ref 98–111)
Creatinine, Ser: 0.97 mg/dL (ref 0.61–1.24)
GFR, Estimated: >60 mL/min (ref >60)
Glucose, Bld: 123 mg/dL — ABNORMAL HIGH (ref 70–99)
Potassium: 3.9 mmol/L (ref 3.5–5.1)
Sodium: 138 mmol/L (ref 135–145)

## 2021-08-07 LAB — TROPONIN I (HIGH SENSITIVITY): Troponin I (High Sensitivity): 7 ng/L (ref <18)

## 2021-08-07 MED ORDER — METHYLPREDNISOLONE SODIUM SUCC 125 MG IJ SOLR
125.0000 mg | Freq: Once | INTRAMUSCULAR | Status: AC
  Administered 2021-08-07: 125 mg via INTRAVENOUS
  Filled 2021-08-07: qty 2

## 2021-08-07 MED ORDER — HYDRALAZINE HCL 20 MG/ML IJ SOLN
10.0000 mg | Freq: Once | INTRAMUSCULAR | Status: AC
  Administered 2021-08-07: 10 mg via INTRAVENOUS
  Filled 2021-08-07: qty 1

## 2021-08-07 MED ORDER — IPRATROPIUM-ALBUTEROL 0.5-2.5 (3) MG/3ML IN SOLN
3.0000 mL | Freq: Once | RESPIRATORY_TRACT | Status: AC
  Administered 2021-08-07: 3 mL via RESPIRATORY_TRACT
  Filled 2021-08-07: qty 3

## 2021-08-07 NOTE — ED Provider Notes (Signed)
? ?[facility] ?Provider Note ? ? ? Event Date/Time  ? First MD Initiated Contact with Patient 08/07/21 2211   ?  (approximate) ? ? ?History  ? ?No chief complaint on file. ? ? ?HPI ? ?Ruben Gonzalez is a 51 y.o. male  who, per outside hospital note dated 8.8.22 has history of asthma, high blood pressure, who presents to the emergency department today because of concern for breathing difficulty. The patient states he has been out of his nebulizer solution for about a month.  The patient started having increasing breathing difficulty this morning.  He does [place] any specific trigger.  Has tried using his rescue inhaler without any significant relief.  Denies any associated chest pain or fever. ? ?Physical Exam  ? ?Triage Vital Signs: ?ED Triage Vitals  ?Enc Vitals Group  ?   BP 08/07/21 2219 (!) 207/140  ?   Pulse Rate 08/07/21 2219 (!) 109  ?   Resp 08/07/21 2218 20  ?   Temp 08/07/21 2216 98.4 ?F (36.9 ?C)  ?   Temp Source 08/07/21 2216 Oral  ?   SpO2 08/07/21 2215 97 %  ?   Weight 08/07/21 2216 180 lb (81.6 kg)  ?   Height 08/07/21 2216 6\' 2"  (1.88 m)  ?   Head Circumference --   ?   Peak Flow --   ?   Pain Score 08/07/21 2213 0  ? ?Most recent vital signs: ?Vitals:  ? 08/07/21 2218 08/07/21 2219  ?BP:  (!) 207/140  ?Pulse:  (!) 109  ?Resp: 20   ?Temp:    ?SpO2:  92%  ? ? ?General: Awake,  ?CV:  Good peripheral perfusion. Tachycardia, regular rhythm ?Resp:  Increased respiratory effort. Diffuse expiratory wheezing. ?Abd:  No distention.  ? ? ? ?ED Results / Procedures / Treatments  ? ?Labs ?(all labs ordered are listed, but only abnormal results are displayed) ?Labs Reviewed  ?CBC WITH DIFFERENTIAL/PLATELET - Abnormal; Notable for the following components:  ?    Result Value  ? RBC 6.40 (*)   ? Hemoglobin 18.2 (*)   ? HCT 56.3 (*)   ? Eosinophils Absolute 0.8 (*)   ? All other components within normal limits  ?BASIC METABOLIC PANEL - Abnormal; Notable for the following components:  ?  Glucose, Bld 123 (*)   ? All other components within normal limits  ?TROPONIN I (HIGH SENSITIVITY)  ? ? ? ?EKG ? ?[id number], attending physician, personally viewed and interpreted this EKG ? ?EKG Time: 2215 ?Rate: 108 ?Rhythm: sinus tachycardia ?Axis: normal ?Intervals: qtc 502 ?QRS: narrow ?ST changes: no st elevation ?Impression: abnormal ekg ? ?RADIOLOGY ?I independently interpreted and visualized the cxr. My interpretation: No pneumonia. No pneumothorax ?Radiology interpretation:  ?IMPRESSION:  ?No active disease.  ? ? ? ?PROCEDURES: ? ?Critical Care performed: No ? ?Procedures ? ? ?MEDICATIONS ORDERED IN ED: ?Medications  ?ipratropium-albuterol (DUONEB) 0.5-2.5 (3) MG/3ML nebulizer solution 3 mL (3 mLs Nebulization Given 08/07/21 2234)  ?ipratropium-albuterol (DUONEB) 0.5-2.5 (3) MG/3ML nebulizer solution 3 mL (3 mLs Nebulization Given 08/07/21 2234)  ?methylPREDNISolone sodium succinate (SOLU-MEDROL) 125 mg/2 mL injection 125 mg (125 mg Intravenous Given 08/07/21 2231)  ?hydrALAZINE (APRESOLINE) injection 10 mg (10 mg Intravenous Given 08/07/21 2252)  ? ? ? ?IMPRESSION / MDM / ASSESSMENT AND PLAN / ED COURSE  ?I reviewed the triage vital signs and the nursing notes. ?             ?               ? ?  Differential diagnosis includes, but is not limited to, asthma, pneumonia, pneumothorax. ? ?Patient presented to the emergency department today because of concerns for asthma exacerbation.  On exam patient has diffuse expiratory wheezing.  Will be given Solu-Medrol as well as a's breathing treatments here in the emergency department.  Chest x-ray without concerning pneumonia.  Additionally patient was found to be quite hypertensive.  The patient had been out of his blood pressure medications for a few days. Will give dose of IV medication for blood pressure. If patient's blood pressure improves and his breathing improves think he could be discharged home, otherwise if continue to having breathing difficulties will  plan on admission. ?  ? ? ?FINAL CLINICAL IMPRESSION(S) / ED DIAGNOSES  ? ?Final diagnoses:  ?Exacerbation of asthma, unspecified asthma severity, unspecified whether persistent  ? ? ? ?Note:  This document was prepared using Dragon voice recognition software and may include unintentional dictation errors. ? ?  ?Phineas Semen, MD ?08/07/21 2332 ? ?

## 2021-08-07 NOTE — ED Notes (Signed)
EDP Derrill Kay notified pt's BP remains elevated at 188/128 with MAP 147. Awaiting reply/orders. ?

## 2021-08-07 NOTE — ED Notes (Signed)
Pt reports productive cough; thick light yellow per pt.  ?

## 2021-08-07 NOTE — ED Notes (Signed)
Lav, lt grn, blue and red tubes sent to lab.  ?

## 2021-08-07 NOTE — ED Notes (Signed)
EKG completed. EDP Archie Balboa at bedside.  ?

## 2021-08-07 NOTE — ED Provider Notes (Signed)
----------------------------------------- ?  11:00 PM on 08/07/2021 ?----------------------------------------- ? ?Blood pressure (!) 188/128, pulse (!) 102, temperature 98.4 ?F (36.9 ?C), temperature source Oral, resp. rate (!) 22, height 6\' 2"  (1.88 m), weight 81.6 kg, SpO2 94 %. ? ?Assuming care from Dr. .  In short, Ruben Gonzalez is a 51 y.o. male with a chief complaint of No chief complaint on file. ?.  Refer to the original H&P for additional details. ? ?The current plan of care is to reassess following nebs. ? ?----------------------------------------- ?12:18 AM on 08/08/2021 ?----------------------------------------- ?On reassessment, patient appears improved with improving respiratory rate and O2 sats of about 94% on 3 L nasal cannula.  We were able to wean oxygen to 2 L/min, however patient still with significant ongoing wheezing at this time.  We will give additional albuterol and he would benefit from admission for further management of asthma exacerbation. ? ?  ?10/08/2021, MD ?08/08/21 0018 ? ?

## 2021-08-07 NOTE — ED Notes (Signed)
Pt given cup of water with verbal okay from EDP Derrill Kay. HOB adjusted per pt request. Pt denies any other needs currently.  ?

## 2021-08-07 NOTE — ED Triage Notes (Signed)
Pt in from home via EMS due to asthma; ran out of neb tx about a month ago; has been using inhaler but without full reilef; two duonebs with EMS; 206/142; 199/137 BP with EMS; history HTN and took BP meds this morning; 97% on neb tx with EMS; 116 pulse with EMS.  ? ?Pt currently belly-breathing. Placed on 4L via [place] as 87-90% RA.  ?

## 2021-08-08 ENCOUNTER — Inpatient Hospital Stay: Payer: 59

## 2021-08-08 ENCOUNTER — Encounter: Payer: Self-pay | Admitting: Internal Medicine

## 2021-08-08 DIAGNOSIS — I160 Hypertensive urgency: Secondary | ICD-10-CM | POA: Diagnosis present

## 2021-08-08 DIAGNOSIS — E663 Overweight: Secondary | ICD-10-CM | POA: Diagnosis present

## 2021-08-08 DIAGNOSIS — I5033 Acute on chronic diastolic (congestive) heart failure: Secondary | ICD-10-CM | POA: Diagnosis present

## 2021-08-08 DIAGNOSIS — J9601 Acute respiratory failure with hypoxia: Secondary | ICD-10-CM

## 2021-08-08 DIAGNOSIS — D751 Secondary polycythemia: Secondary | ICD-10-CM | POA: Diagnosis present

## 2021-08-08 DIAGNOSIS — R739 Hyperglycemia, unspecified: Secondary | ICD-10-CM | POA: Diagnosis present

## 2021-08-08 DIAGNOSIS — J4541 Moderate persistent asthma with (acute) exacerbation: Secondary | ICD-10-CM

## 2021-08-08 DIAGNOSIS — Z6826 Body mass index (BMI) 26.0-26.9, adult: Secondary | ICD-10-CM | POA: Diagnosis not present

## 2021-08-08 DIAGNOSIS — I5032 Chronic diastolic (congestive) heart failure: Secondary | ICD-10-CM

## 2021-08-08 DIAGNOSIS — Z8249 Family history of ischemic heart disease and other diseases of the circulatory system: Secondary | ICD-10-CM | POA: Diagnosis not present

## 2021-08-08 DIAGNOSIS — I110 Hypertensive heart disease with heart failure: Secondary | ICD-10-CM | POA: Diagnosis present

## 2021-08-08 DIAGNOSIS — R000 Tachycardia, unspecified: Secondary | ICD-10-CM | POA: Diagnosis present

## 2021-08-08 DIAGNOSIS — J45901 Unspecified asthma with (acute) exacerbation: Secondary | ICD-10-CM | POA: Diagnosis present

## 2021-08-08 DIAGNOSIS — T380X5A Adverse effect of glucocorticoids and synthetic analogues, initial encounter: Secondary | ICD-10-CM | POA: Diagnosis present

## 2021-08-08 DIAGNOSIS — I10 Essential (primary) hypertension: Secondary | ICD-10-CM | POA: Diagnosis not present

## 2021-08-08 LAB — COMPREHENSIVE METABOLIC PANEL
ALT: 19 U/L (ref 0–44)
AST: 23 U/L (ref 15–41)
Albumin: 4.4 g/dL (ref 3.5–5.0)
Alkaline Phosphatase: 62 U/L (ref 38–126)
Anion gap: 11 (ref 5–15)
BUN: 14 mg/dL (ref 6–20)
CO2: 22 mmol/L (ref 22–32)
Calcium: 9.7 mg/dL (ref 8.9–10.3)
Chloride: 101 mmol/L (ref 98–111)
Creatinine, Ser: 0.81 mg/dL (ref 0.61–1.24)
GFR, Estimated: >60 mL/min (ref >60)
Glucose, Bld: 145 mg/dL — ABNORMAL HIGH (ref 70–99)
Potassium: 4.1 mmol/L (ref 3.5–5.1)
Sodium: 134 mmol/L — ABNORMAL LOW (ref 135–145)
Total Bilirubin: 1.5 mg/dL — ABNORMAL HIGH (ref 0.3–1.2)
Total Protein: 8.2 g/dL — ABNORMAL HIGH (ref 6.5–8.1)

## 2021-08-08 LAB — BRAIN NATRIURETIC PEPTIDE: B Natriuretic Peptide: 92.8 pg/mL (ref 0.0–100.0)

## 2021-08-08 LAB — CBC
HCT: 54.7 % — ABNORMAL HIGH (ref 39.0–52.0)
Hemoglobin: 18.3 g/dL — ABNORMAL HIGH (ref 13.0–17.0)
MCH: 28.9 pg (ref 26.0–34.0)
MCHC: 33.5 g/dL (ref 30.0–36.0)
MCV: 86.4 fL (ref 80.0–100.0)
Platelets: 188 10*3/uL (ref 150–400)
RBC: 6.33 MIL/uL — ABNORMAL HIGH (ref 4.22–5.81)
RDW: 14.8 % (ref 11.5–15.5)
WBC: 7.1 10*3/uL (ref 4.0–10.5)
nRBC: 0 % (ref 0.0–0.2)

## 2021-08-08 LAB — PROCALCITONIN: Procalcitonin: <0.1 ng/mL

## 2021-08-08 LAB — GLUCOSE, CAPILLARY
Glucose-Capillary: 144 mg/dL — ABNORMAL HIGH (ref 70–99)
Glucose-Capillary: 187 mg/dL — ABNORMAL HIGH (ref 70–99)
Glucose-Capillary: 130 mg/dL — ABNORMAL HIGH (ref 70–99)
Glucose-Capillary: 136 mg/dL — ABNORMAL HIGH (ref 70–99)

## 2021-08-08 LAB — EXPECTORATED SPUTUM ASSESSMENT W GRAM STAIN, RFLX TO RESP C: Special Requests: NORMAL

## 2021-08-08 LAB — TSH: TSH: 1.028 u[IU]/mL (ref 0.350–4.500)

## 2021-08-08 LAB — HEMOGLOBIN A1C
Hgb A1c MFr Bld: 5.5 % (ref 4.8–5.6)
Mean Plasma Glucose: 111.15 mg/dL

## 2021-08-08 LAB — PHOSPHORUS: Phosphorus: 2.8 mg/dL (ref 2.5–4.6)

## 2021-08-08 LAB — MAGNESIUM: Magnesium: 1.9 mg/dL (ref 1.7–2.4)

## 2021-08-08 LAB — TROPONIN I (HIGH SENSITIVITY): Troponin I (High Sensitivity): 12 ng/L (ref <18)

## 2021-08-08 MED ORDER — PROCHLORPERAZINE EDISYLATE 10 MG/2ML IJ SOLN: 10.0000 mg | Freq: Four times a day (QID) | INTRAMUSCULAR | Status: DC | PRN

## 2021-08-08 MED ORDER — SODIUM CHLORIDE 3 % IN NEBU
4.0000 mL | INHALATION_SOLUTION | Freq: Two times a day (BID) | RESPIRATORY_TRACT | Status: DC
  Administered 2021-08-08 – 2021-08-09 (×2): 4 mL via RESPIRATORY_TRACT
  Filled 2021-08-08 (×3): qty 4

## 2021-08-08 MED ORDER — DOXYCYCLINE HYCLATE 100 MG IV SOLR
100.0000 mg | Freq: Two times a day (BID) | INTRAVENOUS | Status: DC
  Administered 2021-08-08: 100 mg via INTRAVENOUS
  Filled 2021-08-08 (×2): qty 100

## 2021-08-08 MED ORDER — POLYETHYLENE GLYCOL 3350 17 G PO PACK: 17.0000 g | PACK | Freq: Every day | ORAL | Status: DC | PRN

## 2021-08-08 MED ORDER — ACETAMINOPHEN 325 MG PO TABS: 650.0000 mg | ORAL_TABLET | Freq: Four times a day (QID) | ORAL | Status: DC | PRN

## 2021-08-08 MED ORDER — GUAIFENESIN ER 600 MG PO TB12
600.0000 mg | ORAL_TABLET | Freq: Two times a day (BID) | ORAL | Status: DC
  Administered 2021-08-08 – 2021-08-10 (×5): 600 mg via ORAL
  Filled 2021-08-08 (×5): qty 1

## 2021-08-08 MED ORDER — IPRATROPIUM-ALBUTEROL 0.5-2.5 (3) MG/3ML IN SOLN
3.0000 mL | Freq: Four times a day (QID) | RESPIRATORY_TRACT | Status: DC
  Administered 2021-08-08 – 2021-08-10 (×11): 3 mL via RESPIRATORY_TRACT
  Filled 2021-08-08 (×11): qty 3

## 2021-08-08 MED ORDER — CARVEDILOL 12.5 MG PO TABS
12.5000 mg | ORAL_TABLET | Freq: Two times a day (BID) | ORAL | Status: DC
Start: 2021-08-08 — End: 2021-08-10
  Administered 2021-08-08 – 2021-08-10 (×6): 12.5 mg via ORAL
  Filled 2021-08-08 (×2): qty 1
  Filled 2021-08-08: qty 2
  Filled 2021-08-08 (×3): qty 1

## 2021-08-08 MED ORDER — ENOXAPARIN SODIUM 40 MG/0.4ML IJ SOSY
40.0000 mg | PREFILLED_SYRINGE | INTRAMUSCULAR | Status: DC
  Administered 2021-08-08 – 2021-08-10 (×3): 40 mg via SUBCUTANEOUS
  Filled 2021-08-08 (×3): qty 0.4

## 2021-08-08 MED ORDER — METHYLPREDNISOLONE SODIUM SUCC 40 MG IJ SOLR
40.0000 mg | Freq: Two times a day (BID) | INTRAMUSCULAR | Status: DC
  Administered 2021-08-08 – 2021-08-10 (×6): 40 mg via INTRAVENOUS
  Filled 2021-08-08 (×6): qty 1

## 2021-08-08 MED ORDER — AMLODIPINE BESYLATE 5 MG PO TABS
5.0000 mg | ORAL_TABLET | Freq: Every day | ORAL | Status: DC
  Administered 2021-08-08 – 2021-08-10 (×3): 5 mg via ORAL
  Filled 2021-08-08 (×4): qty 1

## 2021-08-08 MED ORDER — FUROSEMIDE 10 MG/ML IJ SOLN
20.0000 mg | Freq: Two times a day (BID) | INTRAMUSCULAR | Status: DC
  Administered 2021-08-08 – 2021-08-10 (×4): 20 mg via INTRAVENOUS
  Filled 2021-08-08 (×4): qty 4

## 2021-08-08 MED ORDER — PNEUMOCOCCAL 20-VAL CONJ VACC 0.5 ML IM SUSY
0.5000 mL | PREFILLED_SYRINGE | INTRAMUSCULAR | Status: DC
  Filled 2021-08-08 (×2): qty 0.5

## 2021-08-08 MED ORDER — MELATONIN 5 MG PO TABS
2.5000 mg | ORAL_TABLET | Freq: Every evening | ORAL | Status: DC | PRN
  Administered 2021-08-08: 2.5 mg via ORAL
  Filled 2021-08-08: qty 1

## 2021-08-08 MED ORDER — FUROSEMIDE 10 MG/ML IJ SOLN
20.0000 mg | Freq: Once | INTRAMUSCULAR | Status: AC
  Administered 2021-08-08: 20 mg via INTRAVENOUS
  Filled 2021-08-08: qty 4

## 2021-08-08 MED ORDER — ALBUTEROL SULFATE (2.5 MG/3ML) 0.083% IN NEBU
2.5000 mg | INHALATION_SOLUTION | Freq: Once | RESPIRATORY_TRACT | Status: AC
  Administered 2021-08-08: 2.5 mg via RESPIRATORY_TRACT
  Filled 2021-08-08: qty 3

## 2021-08-08 MED ORDER — INSULIN ASPART 100 UNIT/ML IJ SOLN
0.0000 [IU] | Freq: Three times a day (TID) | INTRAMUSCULAR | Status: DC
  Administered 2021-08-08: 1 [IU] via SUBCUTANEOUS
  Administered 2021-08-08: 2 [IU] via SUBCUTANEOUS
  Administered 2021-08-08 – 2021-08-09 (×2): 1 [IU] via SUBCUTANEOUS
  Administered 2021-08-09: 2 [IU] via SUBCUTANEOUS
  Administered 2021-08-10: 3 [IU] via SUBCUTANEOUS
  Filled 2021-08-08 (×6): qty 1

## 2021-08-08 MED ORDER — SODIUM CHLORIDE 0.9 % IV SOLN
2.0000 g | INTRAVENOUS | Status: DC
  Administered 2021-08-08: 2 g via INTRAVENOUS
  Filled 2021-08-08: qty 20

## 2021-08-08 MED ORDER — LACTATED RINGERS IV SOLN: INTRAVENOUS | Status: DC

## 2021-08-08 MED ORDER — INSULIN ASPART 100 UNIT/ML IJ SOLN: 0.0000 [IU] | Freq: Every day | INTRAMUSCULAR | Status: DC

## 2021-08-08 MED ORDER — HYDRALAZINE HCL 10 MG PO TABS
10.0000 mg | ORAL_TABLET | Freq: Three times a day (TID) | ORAL | Status: DC
  Administered 2021-08-08 – 2021-08-10 (×7): 10 mg via ORAL
  Filled 2021-08-08 (×7): qty 1

## 2021-08-08 NOTE — Assessment & Plan Note (Addendum)
Put on steroids and nebulizers.  On time of discharge, still having some wheezing somewhat so we will discharge on steroids plus as needed albuterol inhaler..  Although questionable pneumonia seen on CT, procalcitonin level normal, patient afebrile with normal white blood cell count, would not treat for pneumonia at this time.  Patient does not have a PCP so given a referral ?

## 2021-08-08 NOTE — Hospital Course (Addendum)
51 year old male with past medical history of asthma, hypertension, chronic diastolic heart failure who presented to the emergency room 4/8 with complaints of shortness of breath that started earlier that day.  Patient had noted intermittent dyspnea for the past 2 weeks.  The emergency room, patient noted to be hypoxic with oxygen saturations in the 80s on room air requiring 3 L nasal cannula.  With breathing treatments and IV steroids, able to be weaned down to 2 L nasal cannula.  Chest x-ray unremarkable.  Patient admitted to the hospitalist service for an asthma exacerbation. ?

## 2021-08-08 NOTE — Assessment & Plan Note (Addendum)
Secondary to steroids.  A1c at 5.5. ?

## 2021-08-08 NOTE — Assessment & Plan Note (Addendum)
Grade 1 diastolic dysfunction seen on echocardiogram in 2017.  Normal BNP, although you can see this with diastolic heart failure.  After multiple doses of Lasix, patient diuresed over 2.5 L and is almost -2 L deficient. ?

## 2021-08-08 NOTE — TOC Initial Note (Addendum)
Transition of Care (TOC) - Initial/Assessment Note  ? ? ?Patient Details  ?Name: Ruben Gonzalez ?MRN: [id number] ?Date of Birth: Dec 19, 1970 ? ?Transition of Care (TOC) CM/SW Contact:    ?Eljay Lave E Avy Barlett, LCSW ?Phone Number: ?08/08/2021, 10:13 AM ? ?Clinical Narrative:                CSW spoke with patient due to patient being uninsured. ?Patient lives alone. Patient does not drive, states he either pays his neighbor to take him places or gets a cab. Patient says he sees a Civil engineer, contracting in [place], Dr. Shea Evans. Patient uses Psychologist, forensic but at times has trouble affording medications/rides to get medications.  ?Explained [facility] and [facility]. Patient is interested in both. Printed application. [facility] referral also made in [facility]. Pharmacy updated in chart.  ?Explained meds can be delivered to bedside from [facility] prior to DC, and patient would need to follow up with [facility] and [facility] on getting refills/PCP care. Patient verbalized understanding.  ? ? ?Expected Discharge Plan: Home/Self Care ?Barriers to Discharge: Continued Medical Work up ? ? ?Patient Goals and CMS Choice ?Patient states their goals for this hospitalization and ongoing recovery are:: to return home ?CMS Medicare.gov Compare Post Acute Care list provided to:: Patient ?Choice offered to / list presented to : Patient ? ?Expected Discharge Plan and Services ?Expected Discharge Plan: Home/Self Care ?  ?  ?  ?Living arrangements for the past 2 months: Single Family Home ?                ?  ?  ?  ?  ?  ?  ?  ?  ?  ?  ? ?Prior Living Arrangements/Services ?Living arrangements for the past 2 months: Single Family Home ?Lives with:: Self ?Patient language and need for interpreter reviewed:: Yes ?Do you feel safe going back to the place where you live?: Yes      ?  ?Care giver support system in place?: No (comment) ?  ?Criminal Activity/Legal Involvement Pertinent to Current Situation/Hospitalization: No -  Comment as needed ? ?Activities of Daily Living ?Home Assistive Devices/Equipment: Eyeglasses ?ADL Screening (condition at time of admission) ?Patient's cognitive ability adequate to safely complete daily activities?: Yes ?Is the patient deaf or have difficulty hearing?: No ?Does the patient have difficulty seeing, even when wearing glasses/contacts?: No ?Does the patient have difficulty concentrating, remembering, or making decisions?: No ?Patient able to express need for assistance with ADLs?: Yes ?Does the patient have difficulty dressing or bathing?: No ?Independently performs ADLs?: Yes (appropriate for developmental age) ?Does the patient have difficulty walking or climbing stairs?: No ?Weakness of Legs: None ?Weakness of Arms/Hands: None ? ?Permission Sought/Granted ?Permission sought to share information with : Magazine features editor ?Permission granted to share information with : Yes, Verbal Permission Granted ?   ? Permission granted to share info w AGENCY: [facility], [facility] ?   ?   ? ?Emotional Assessment ?  ?  ?  ?Orientation: : Oriented to Self, Oriented to Place, Oriented to  Time, Oriented to Situation ?Alcohol / Substance Use: Not Applicable ?Psych Involvement: No (comment) ? ?Admission diagnosis:  Asthma exacerbation [J45.901] ?Exacerbation of asthma, unspecified asthma severity, unspecified whether persistent [J45.901] ?Patient Active Problem List  ? Diagnosis Date Noted  ? Overweight (BMI 25.0-29.9) 08/08/2021  ? Chronic diastolic CHF (congestive heart failure) (HCC) 08/08/2021  ? Acute respiratory failure with hypoxia (HCC)   ? Essential hypertension   ?  Asthma exacerbation 08/13/2020  ? Unresponsive episode 01/31/2016  ? Acute renal failure (HCC) 01/31/2016  ? Hypokalemia 01/31/2016  ? Alcohol abuse 01/31/2016  ? Marijuana abuse 01/31/2016  ? Hyperglycemia 01/31/2016  ? Carotid artery stenosis 01/31/2016  ? Hypotension 01/31/2016  ? Dehydration 01/31/2016  ? ?PCP:  Pcp,  No ?Pharmacy:   ?[facility] ?[address] ?[place] [address] ?Phone: [phone] Fax: [phone] ? ?[facility] - [place] ([place] - [address] ?[address] ?[place] [address] ?Phone: [phone] Fax: [phone] ? ? ? ? ?Social Determinants of Health (SDOH) Interventions ?  ? ?Readmission Risk Interventions ?   ? View : No data to display.  ?  ?  ?  ? ? ? ?

## 2021-08-08 NOTE — Progress Notes (Signed)
[facility] Progress Note ? ?Patient: Ruben Gonzalez    [id number]  DOA: 08/07/2021    ?Date of Service: the patient was seen and examined on 08/08/2021 ? ?Brief hospital course: ?51 year old male with past medical history of asthma, hypertension, chronic diastolic heart failure who presented to the emergency room 4/8 with complaints of shortness of breath that started earlier that day.  Patient had noted intermittent dyspnea for the past 2 weeks.  The emergency room, patient noted to be hypoxic with oxygen saturations in the 80s on room air requiring 3 L nasal cannula.  With breathing treatments and IV steroids, able to be weaned down to 2 L nasal cannula.  Chest x-ray unremarkable.  Patient mated to the hospitalist service for an asthma exacerbation. ? ?Assessment and Plan: ?Assessment and Plan: ?* Asthma exacerbation ?Continue steroids, nebulizers and oxygen.  Try to wean down oxygen.  Although questionable pneumonia seen on CT, procalcitonin level normal, patient afebrile with normal white blood cell count, would not treat for pneumonia at this time ? ?Acute respiratory failure with hypoxia ([place]) ?Oxygen support.  Treat underlying issues. ? ?Essential hypertension ?With hypertensive urgency.  Patient just on Coreg 12.5 p.o. twice daily.  Some of this urgency may be due to heart failure or stress from hypoxia.  We will check a BNP.  We will try dose of Lasix, as needed hydralazine ? ?Chronic diastolic CHF (congestive heart failure) ([place]) ?Grade 1 diastolic dysfunction seen on echocardiogram in 2017.  Normal BNP although with 1 dose of Lasix, patient is already put out a liter of fluid.  We will give another dose of Lasix this evening ? ?Overweight (BMI 25.0-29.9) ?Meets criteria with BMI greater than 25 ? ?Hyperglycemia ?In part due to steroids.  In 2017, he was prediabetic.  We will check an A1c. ? ? ? ? ? ? ?Body mass index is 26.6 kg/m?.  ?  ?    ? ?Consultants: ?None ? ?Procedures: ?None ? ?Antimicrobials: ?IV Rocephin and Zithromax 4/9 only ? ?Code Status: Full code ? ? ?Subjective: Patient still complains of some dyspnea on exertion, but wheezing and breathing better than earlier ? ?Objective: ?Noted elevated blood pressures ?Vitals:  ? 08/08/21 0527 08/08/21 0800  ?BP: (!) 154/108 (!) 189/113  ?Pulse: 94   ?Resp: 18 16  ?Temp: 98.7 ?F (37.1 ?C) 98.6 ?F (37 ?C)  ?SpO2: 95%   ? ? ?Intake/Output Summary (Last 24 hours) at 08/08/2021 1321 ?Last data filed at 08/08/2021 1100 ?Gross per 24 hour  ?Intake 30.83 ml  ?Output 1000 ml  ?Net -969.17 ml  ? ?Filed Weights  ? 08/07/21 2216 08/08/21 0217  ?Weight: 81.6 kg 81.7 kg  ? ?Body mass index is 26.6 kg/m?. ? ?Exam: ? ?General: Alert and oriented x3, no acute distress ?HEENT: Normocephalic atraumatic, mucous membranes are moist ?Cardiovascular: Regular rate and rhythm, S1-S2 ?Respiratory: Bilateral end expiratory wheeze, only moderate airflow ?Abdomen: Soft, nontender, nondistended, positive bowel sounds ?Musculoskeletal: No clubbing or cyanosis or edema ?Skin: No skin breaks, tears or lesions ?Psychiatry: Appropriate, no evidence of psychoses ?Neurology: No focal deficits ? ?Data Reviewed: ?Normal BNP and procalcitonin.  A1c pending ? ?Disposition:  ?Status is: Inpatient ?Remains inpatient appropriate because: Continued shortness of breath ?  ? ?Anticipated discharge date: 4/10 on 4/11 ? ?Remaining issues to be resolved so that patient can be discharged: Resolution of hypoxia ? ? ?Family Communication: Left message for family ?DVT Prophylaxis: ?enoxaparin (LOVENOX) injection 40 mg Start: 08/08/21 0800 ? ? ? ?Author: ?Trenton Gammon  [place] Pink ,MD ?08/08/2021 1:21 PM ? ?To reach On-call, see care teams to locate the attending and reach out via [email or web address]. ?Between 7PM-7AM, please contact night-coverage ?If you still have difficulty reaching the attending provider, please page the [facility] (Director on Call) for [facility] on amion for assistance. ? ?

## 2021-08-08 NOTE — Assessment & Plan Note (Signed)
Meets criteria with BMI greater than 25 

## 2021-08-08 NOTE — Plan of Care (Signed)
  Problem: Clinical Measurements: Goal: Respiratory complications will improve Outcome: Progressing   Problem: Activity: Goal: Risk for activity intolerance will decrease Outcome: Progressing   

## 2021-08-08 NOTE — Assessment & Plan Note (Addendum)
Over the next 2 days, patient able to be weaned off of oxygen.  By 4/11, he was able to ambulate on room air and still keep oxygen saturations above 90%.  Still noted to have some wheezing, so will be discharged on steroids. ?

## 2021-08-08 NOTE — Plan of Care (Signed)
?  Problem: Clinical Measurements: ?Goal: Respiratory complications will improve ?08/08/2021 0608 by Whitney Post, RN ?Outcome: Progressing ?08/08/2021 0316 by Whitney Post, RN ?Outcome: Progressing ?  ?Problem: Activity: ?Goal: Risk for activity intolerance will decrease ?08/08/2021 0608 by Whitney Post, RN ?Outcome: Progressing ?08/08/2021 0316 by Whitney Post, RN ?Outcome: Progressing ?  ?

## 2021-08-08 NOTE — H&P (Addendum)
?History and Physical ? ?Ruben Gonzalez ZOX:[id number] DOB: 02/17/1971 DOA: 08/07/2021 ? ?Referring physician: Dr. Larinda ButteryJessup, EDP  ?PCP: Pcp, No  ?Outpatient Specialists: None ?Patient coming from: Home ? ?Chief Complaint: Worsening shortness of breath, ran out of his albuterol nebulizer x > 1 month. ? ?HPI: Ruben Gonzalez is a [age] y.o. male with medical history significant for asthma (not followed by pulmonary), essential hypertension, chronic diastolic CHF, who [facility] with complaints of worsening shortness of breath with onset this morning.  No use of tobacco or exposure to secondhand smoking.  Associated with audible wheezing and a productive cough of yellow sputum.  Has had intermittent dyspnea for the past 2 weeks.  Not improved by his albuterol inhaler.  Ran out of his albuterol nebulizer more than a month ago.  Denies fevers or chills.  Patient was brought in by EMS.  In the ED, patient is noted to have audible wheezing and diffuse rales on exam.  Hypoxic with oxygen saturation in the 80s and requiring 3 L to maintain his oxygen saturation greater than 90%.  After 2 breathing treatments and IV Solu-Medrol, he was subsequently weaned to 2 L nasal cannula.  Chest x-ray with no active disease.  EDP requested admission for management of acute asthma exacerbation.  [facility] ? ?ED Course: Tmax 99.1.  BP 151/97, pulse 86, respiratory 20, O2 saturation 89% on 2 L.  Lab studies remarkable for hemoglobin of 18.2 and hematocrit of 56.3.  Serum glucose 123. ? ?Review of Systems: ?Review of systems as noted in the HPI. All other systems reviewed and are negative. ? ? ?Past Medical History:  ?Diagnosis Date  ? Asthma   ? Hypertension   ? ?Past Surgical History:  ?Procedure Laterality Date  ? VASECTOMY    ? ? ?Social History:  reports that he has never smoked. He has never used smokeless tobacco. He reports current alcohol use. He reports that he does not currently use drugs after  having used the following drugs: Marijuana. ? ? ?No Known Allergies ? ?Family History  ?Problem Relation Age of Onset  ? Heart attack Mother   ?  ? ? ?Prior to Admission medications   ?Medication Sig Start Date End Date Taking? Authorizing Provider  ?albuterol (VENTOLIN HFA) 108 (90 Base) MCG/ACT inhaler Inhale 2 puffs into the lungs every 6 (six) hours as needed for wheezing or shortness of breath. 08/14/20  Yes Wieting, Richard, MD  ?carvedilol (COREG) 12.5 MG tablet Take 1 tablet (12.5 mg total) by mouth 2 (two) times daily. 08/14/20  Yes Wieting, Richard, MD  ?albuterol (ACCUNEB) 1.25 MG/3ML nebulizer solution Inhale by nebulization 6 mLs (2.5 mg total) every 4 (four) hours as needed for wheezing or shortness of breath. 08/14/20 08/07/21  Alford HighlandWieting, Richard, MD  ? ? ?Physical Exam: ?BP (!) 151/97 (BP Location: Right Arm)   Pulse 86   Temp 98.7 ?F (37.1 ?C)   Resp 20   Ht 5\' 9"  (1.753 m)   Wt 81.7 kg   SpO2 (!) 89%   BMI 26.60 kg/m?  ? ?General: 51 y.o. year-old male well developed well nourished in no acute distress.  Alert and oriented x3. ?Cardiovascular: Tachycardic with no rubs or gallops.  No thyromegaly or JVD noted.  No lower extremity edema. 2/4 pulses in all 4 extremities. ?Respiratory: Significant diffuse wheezing and rales bilaterally.  Poor inspiratory effort. ?Abdomen: Soft nontender nondistended with normal bowel sounds x4 quadrants. ?Muskuloskeletal: No cyanosis, clubbing or edema noted bilaterally ?  Neuro: CN II-XII intact, strength, sensation, reflexes ?Skin: No ulcerative lesions noted or rashes ?Psychiatry: Judgement and insight appear normal. Mood is appropriate for condition and setting ?   ?   ?   ?Labs on Admission:  ?Basic Metabolic Panel: ?Recent Labs  ?Lab 08/07/21 ?2223  ?NA 138  ?K 3.9  ?CL 100  ?CO2 29  ?GLUCOSE 123*  ?BUN 13  ?CREATININE 0.97  ?CALCIUM 9.7  ? ?Liver Function Tests: ?No results for input(s): AST, ALT, ALKPHOS, BILITOT, PROT, ALBUMIN in the last 168 hours. ?No results  for input(s): LIPASE, AMYLASE in the last 168 hours. ?No results for input(s): AMMONIA in the last 168 hours. ?CBC: ?Recent Labs  ?Lab 08/07/21 ?2223  ?WBC 8.0  ?NEUTROABS 5.2  ?HGB 18.2*  ?HCT 56.3*  ?MCV 88.0  ?PLT 185  ? ?Cardiac Enzymes: ?No results for input(s): CKTOTAL, CKMB, CKMBINDEX, TROPONINI in the last 168 hours. ? ?BNP (last 3 results) ?No results for input(s): BNP in the last 8760 hours. ? ?ProBNP (last 3 results) ?No results for input(s): PROBNP in the last 8760 hours. ? ?CBG: ?No results for input(s): GLUCAP in the last 168 hours. ? ?Radiological Exams on Admission: ?DG Chest Portable 1 View ? ?Result Date: 08/07/2021 ?CLINICAL DATA:  Asthma. EXAM: PORTABLE CHEST 1 VIEW COMPARISON:  Sep 28, 2020 FINDINGS: The heart size and mediastinal contours are within normal limits. Both lungs are clear. The visualized skeletal structures are unremarkable. IMPRESSION: No active disease. Electronically Signed   By: Aram Candela M.D.   On: 08/07/2021 22:55   ? ?EKG: I independently viewed the EKG done and my findings are as followed: Sinus tachycardia rate of 108.  Nonspecific ST-T changes.  QTc 502. ? ?Assessment/Plan ?Present on Admission: ? Asthma exacerbation ? ?Principal Problem: ?  Asthma exacerbation ? ?Acute asthma exacerbation, unclear trigger ?Chest x-ray no active disease. ?Significant audible wheezing and rales on exam ?On presentation with hypoxia, O2 saturation in the 80s on room air. ?Received IV Solu-Medrol and DuoNebs in the ED, continue ?Maintain O2 saturation greater than 92%. ?Add IV antibiotics, Rocephin and Doxycycline, cannot rule out early pneumonia ?Add pulmonary toilet with hypersaline nebs twice daily, chest physiotherapy twice daily, Mucinex twice daily x3 days ?Add respiratory viral panel and sputum culture, follow results ?Chest x-ray personally reviewed is equivocal for pneumonia, obtain CT chest without contrast to further assess lungs parenchyma. ?Incentive spirometer ?Mobilize  as tolerated ?Addendum: Patient does not follow with pulmonary outpatient, will benefit from pulmonary follow-up-consulted pulmonary ? ?Acute hypoxic respiratory failure secondary to acute asthma exacerbation. ?Not on oxygen supplementation at baseline ?O2 saturation in the 80s in the ED ?Currently on 2 L with O2 saturation above 88% ?Obtain home oxygen evaluation for DC planning ?Continue to treat underlying conditions and wean off oxygen supplementation as tolerated ? ?Sinus tachycardia likely multifactorial ?Unclear how compliant he is with his medication, if not compliant possibly withdrawal from beta-blocker ?Suspect sinus tachycardia is exacerbated by albuterol ?Resume home Coreg 12.5 mg twice daily ?Obtain TSH ?Gentle IV fluid hydration, LR at 50 cc/h x 1 day ?Continue to treat underlying conditions ? ?Prolonged QTc ?Admission twelve-lead EKG with QTc greater than 500 ?Optimize magnesium and potassium levels ?Avoid QTc prolonging agents ?Repeat twelve-lead EKG in the morning. ? ?Hyperglycemia with history of prediabetes, likely exacerbated by IV Solu-Medrol ?Last hemoglobin A1c from 2017 of 5.8 ?Obtain hemoglobin A1c ?Start insulin sliding scale ? ?Erythrocytosis, unclear etiology ?Presented with hemoglobin of 15.2, hematocrit 56.3, RBC 6.4. ?Start gentle IV  fluid hydration LR 50 cc/h x 1 day ?Repeat CBC ? ? ?Critical care time: 65 minutes ? ? ?DVT prophylaxis: Subcu Lovenox daily ? ?Code Status: Full code. ? ?Family Communication: None at bedside. ? ?Disposition Plan: Admit to telemetry medical. ? ?Consults called: Pulmonary. ? ?Admission status: Inpatient status. ? ? ?Status is: Inpatient ?Patient requires at least 2 midnights for further evaluation and treatment of present condition. ? ? ?Darlin Drop MD ?[facility] ?Pager [phone] ? ?If 7PM-7AM, please contact night-coverage ?[email or web address] ?Password [email or web address] ? ?08/08/2021, 4:29 AM   ?

## 2021-08-08 NOTE — Assessment & Plan Note (Addendum)
With hypertensive urgency.  Patient just on Coreg 12.5 p.o. twice daily.  Improved by starting hydralazine.  Will discharge on hydralazine as well is continue his Coreg ?

## 2021-08-09 DIAGNOSIS — I5033 Acute on chronic diastolic (congestive) heart failure: Secondary | ICD-10-CM

## 2021-08-09 LAB — GLUCOSE, CAPILLARY
Glucose-Capillary: 116 mg/dL — ABNORMAL HIGH (ref 70–99)
Glucose-Capillary: 180 mg/dL — ABNORMAL HIGH (ref 70–99)
Glucose-Capillary: 141 mg/dL — ABNORMAL HIGH (ref 70–99)
Glucose-Capillary: 133 mg/dL — ABNORMAL HIGH (ref 70–99)

## 2021-08-09 LAB — BASIC METABOLIC PANEL
Anion gap: 9 (ref 5–15)
BUN: 24 mg/dL — ABNORMAL HIGH (ref 6–20)
CO2: 29 mmol/L (ref 22–32)
Calcium: 9.3 mg/dL (ref 8.9–10.3)
Chloride: 99 mmol/L (ref 98–111)
Creatinine, Ser: 0.96 mg/dL (ref 0.61–1.24)
GFR, Estimated: >60 mL/min (ref >60)
Glucose, Bld: 137 mg/dL — ABNORMAL HIGH (ref 70–99)
Potassium: 4.1 mmol/L (ref 3.5–5.1)
Sodium: 137 mmol/L (ref 135–145)

## 2021-08-09 NOTE — TOC Progression Note (Signed)
Transition of Care (TOC) - Progression Note  ? ? ?Patient Details  ?Name: Ruben Gonzalez ?MRN: [id number] ?Date of Birth: 1970/10/16 ? ?Transition of Care (TOC) CM/SW Contact  ?Truddie Hidden, RN ?Phone Number: ?08/09/2021, 12:13 PM ? ?Clinical Narrative:    ?Discharge today was pending oxygen saturation. Per nurse patient ambulated with nurse and experienced desaturation. Discharge likely tomorrow. Referral previously made to [facility] upon d/c ? ?Expected Discharge Plan: Home/Self Care ?Barriers to Discharge: Continued Medical Work up ? ?Expected Discharge Plan and Services ?Expected Discharge Plan: Home/Self Care ?  ?  ?  ?Living arrangements for the past 2 months: Single Family Home ?                ?  ?  ?  ?  ?  ?  ?  ?  ?  ?  ? ? ?Social Determinants of Health (SDOH) Interventions ?  ? ?Readmission Risk Interventions ?   ? View : No data to display.  ?  ?  ?  ? ? ?

## 2021-08-09 NOTE — Progress Notes (Signed)
Ambulated patient in room without O2, O2 saturation 89%-91%. HR ST 106-120 ?

## 2021-08-09 NOTE — Progress Notes (Signed)
[facility] Progress Note ? ?Patient: Ruben Gonzalez    [id number]  DOA: 08/07/2021    ?Date of Service: the patient was seen and examined on 08/09/2021 ? ?Brief hospital course: ?51 year old male with past medical history of asthma, hypertension, chronic diastolic heart failure who presented to the emergency room 4/8 with complaints of shortness of breath that started earlier that day.  Patient had noted intermittent dyspnea for the past 2 weeks.  The emergency room, patient noted to be hypoxic with oxygen saturations in the 80s on room air requiring 3 L nasal cannula.  With breathing treatments and IV steroids, able to be weaned down to 2 L nasal cannula.  Chest x-ray unremarkable.  Patient mated to the hospitalist service for an asthma exacerbation. ? ?Assessment and Plan: ?Assessment and Plan: ?* Asthma exacerbation ?Continue steroids and nebulizers.  Patient still a bit dyspneic and wheezing.  Try to wean down oxygen.  Although questionable pneumonia seen on CT, procalcitonin level normal, patient afebrile with normal white blood cell count, would not treat for pneumonia at this time ? ?Acute respiratory failure with hypoxia ([place]) ?Patient able to be weaned off of oxygen at rest.  However, when ambulating, oxygen saturations dropped to 89% and he is tachycardic.  This should improve over the next 24 hours ? ?Acute on chronic diastolic CHF (congestive heart failure) ([place]) ?Grade 1 diastolic dysfunction seen on echocardiogram in 2017.  Normal BNP although with multiple doses of Lasix, he has diuresed over 2.1 L and is -1.5 L deficient.  Continue Lasix.  Likely a small contributing factor to his hypoxia ? ?Essential hypertension ?With hypertensive urgency.  Patient just on Coreg 12.5 p.o. twice daily.  Some of this urgency may be due to heart failure or stress from hypoxia.  We will check a BNP.  We will try dose of Lasix, as needed hydralazine ? ?Hyperglycemia ?Secondary to steroids.  A1c at  5.5. ? ?Overweight (BMI 25.0-29.9) ?Meets criteria with BMI greater than 25 ? ? ? ? ? ? ?Body mass index is 26.6 kg/m?.  ?  ?   ? ?Consultants: ?None ? ?Procedures: ?None ? ?Antimicrobials: ?IV Rocephin and Zithromax 4/9 only ? ?Code Status: Full code ? ? ?Subjective: Still short of breath although improving ? ?Objective: ?Noted elevated blood pressures ?Vitals:  ? 08/09/21 0832 08/09/21 1003  ?BP:  138/86  ?Pulse:  91  ?Resp:  16  ?Temp:  98 ?F (36.7 ?C)  ?SpO2: 95% 96%  ? ? ?Intake/Output Summary (Last 24 hours) at 08/09/2021 1359 ?Last data filed at 08/09/2021 0900 ?Gross per 24 hour  ?Intake 606.17 ml  ?Output 1150 ml  ?Net -543.83 ml  ? ? ?Filed Weights  ? 08/07/21 2216 08/08/21 0217  ?Weight: 81.6 kg 81.7 kg  ? ?Body mass index is 26.6 kg/m?. ? ?Exam: ? ?General: Alert and oriented x3, no acute distress ?HEENT: Normocephalic atraumatic, mucous membranes are moist ?Cardiovascular: Regular rate and rhythm, S1-S2 ?Respiratory: Bilateral end expiratory wheeze, better airflow ?Abdomen: Soft, nontender, nondistended, positive bowel sounds ?Musculoskeletal: No clubbing or cyanosis or edema ?Skin: No skin breaks, tears or lesions ?Psychiatry: Appropriate, no evidence of psychoses ?Neurology: No focal deficits ? ?Data Reviewed: ?A1c at 5.5 ?Disposition:  ?Status is: Inpatient ?Remains inpatient appropriate because: Continued wheezing, mild hypoxia with ambulation. ?  ? ?Anticipated discharge date: 4/11 ? ?Remaining issues to be resolved so that patient can be discharged: Resolution of hypoxia ? ? ?Family Communication: Left message for family ?DVT Prophylaxis: ?enoxaparin (LOVENOX) injection 40  mg Start: 08/08/21 0800 ? ? ? ?Author: ?Annita Brod ,MD ?08/09/2021 1:59 PM ? ?To reach On-call, see care teams to locate the attending and reach out via [email or web address]. ?Between 7PM-7AM, please contact night-coverage ?If you still have difficulty reaching the attending provider, please page the [facility] (Director on Call) for  [facility] on amion for assistance. ? ?

## 2021-08-10 DIAGNOSIS — I10 Essential (primary) hypertension: Secondary | ICD-10-CM

## 2021-08-10 LAB — RESPIRATORY PANEL BY PCR

## 2021-08-10 LAB — GLUCOSE, CAPILLARY
Glucose-Capillary: 118 mg/dL — ABNORMAL HIGH (ref 70–99)
Glucose-Capillary: 209 mg/dL — ABNORMAL HIGH (ref 70–99)

## 2021-08-10 MED ORDER — ALBUTEROL SULFATE HFA 108 (90 BASE) MCG/ACT IN AERS: 2.0000 | INHALATION_SPRAY | Freq: Four times a day (QID) | RESPIRATORY_TRACT | 0 refills | Status: DC | PRN

## 2021-08-10 MED ORDER — HYDRALAZINE HCL 10 MG PO TABS
10.0000 mg | ORAL_TABLET | Freq: Three times a day (TID) | ORAL | 1 refills | Status: DC
Start: 2021-08-10 — End: 2022-02-11

## 2021-08-10 MED ORDER — PREDNISONE 10 MG PO TABS: ORAL_TABLET | ORAL | 0 refills | Status: AC

## 2021-08-10 NOTE — Progress Notes (Signed)
Checked 02 at room air, 95%, with ambulation in the hall way around the nursing station, 94%, and back to the room at rest 95%. ?

## 2021-08-10 NOTE — Discharge Summary (Signed)
?Physician Discharge Summary ?  ?Patient: Ruben Gonzalez MRN: [id number] DOB: 26-Sep-1970  ?Admit date:     08/07/2021  ?Discharge date: 08/10/21  ?Discharge Physician: Annita Brod  ? ?PCP: Pcp, No  ? ?Recommendations at discharge:  ? ?New medication: Prednisone taper ?New medication: Hydralazine 10 mg p.o. 3 times daily ?Patient given refill for albuterol inhaler ?Patient given referral for new primary care physician ? ?Discharge Diagnoses: ?Principal Problem: ?  Asthma exacerbation ?Active Problems: ?  Acute respiratory failure with hypoxia ([place]) ?  Essential hypertension ?  Acute on chronic diastolic CHF (congestive heart failure) ([place]) ?  Hyperglycemia ?  Overweight (BMI 25.0-29.9) ? ?Resolved Problems: ?  * No resolved hospital problems. * ? ?Hospital Course: ?51 year old male with past medical history of asthma, hypertension, chronic diastolic heart failure who presented to the emergency room 4/8 with complaints of shortness of breath that started earlier that day.  Patient had noted intermittent dyspnea for the past 2 weeks.  The emergency room, patient noted to be hypoxic with oxygen saturations in the 80s on room air requiring 3 L nasal cannula.  With breathing treatments and IV steroids, able to be weaned down to 2 L nasal cannula.  Chest x-ray unremarkable.  Patient admitted to the hospitalist service for an asthma exacerbation. ? ?Assessment and Plan: ?* Asthma exacerbation ?Put on steroids and nebulizers.  On time of discharge, still having some wheezing somewhat so we will discharge on steroids plus as needed albuterol inhaler..  Although questionable pneumonia seen on CT, procalcitonin level normal, patient afebrile with normal white blood cell count, would not treat for pneumonia at this time.  Patient does not have a PCP so given a referral ? ?Acute respiratory failure with hypoxia ([place]) ?Over the next 2 days, patient able to be weaned off of oxygen.  By 4/11, he was able to ambulate on room  air and still keep oxygen saturations above 90%.  Still noted to have some wheezing, so will be discharged on steroids. ? ?Acute on chronic diastolic CHF (congestive heart failure) ([place]) ?Grade 1 diastolic dysfunction seen on echocardiogram in 2017.  Normal BNP, although you can see this with diastolic heart failure.  After multiple doses of Lasix, patient diuresed over 2.5 L and is almost -2 L deficient. ? ?Essential hypertension ?With hypertensive urgency.  Patient just on Coreg 12.5 p.o. twice daily.  Improved by starting hydralazine.  Will discharge on hydralazine as well is continue his Coreg ? ?Hyperglycemia ?Secondary to steroids.  A1c at 5.5. ? ?Overweight (BMI 25.0-29.9) ?Meets criteria with BMI greater than 25 ? ? ? ? ?  ? ? ?Consultants: None ?Procedures performed: None ?Disposition: Home ?Diet recommendation:  ?Discharge Diet Orders (From admission, onward)  ? ?  Start     Ordered  ? 08/10/21 0000  Diet - low sodium heart healthy       ? 08/10/21 1259  ? ?  ?  ? ?  ? ?Cardiac diet ?DISCHARGE MEDICATION: ?Allergies as of 08/10/2021   ?No Known Allergies ?  ? ?  ?Medication List  ?  ? ?TAKE these medications   ? ?albuterol 1.25 MG/3ML nebulizer solution ?Commonly known as: ACCUNEB ?Inhale by nebulization 6 mLs (2.5 mg total) every 4 (four) hours as needed for wheezing or shortness of breath. ?  ?albuterol 108 (90 Base) MCG/ACT inhaler ?Commonly known as: VENTOLIN HFA ?Inhale 2 puffs into the lungs every 6 (six) hours as needed for wheezing or shortness of breath. ?  ?carvedilol  12.5 MG tablet ?Commonly known as: COREG ?Take 1 tablet (12.5 mg total) by mouth 2 (two) times daily. ?  ?hydrALAZINE 10 MG tablet ?Commonly known as: APRESOLINE ?Take 1 tablet (10 mg total) by mouth every 8 (eight) hours. ?  ?predniSONE 10 MG tablet ?Commonly known as: DELTASONE ?Take 5 tablets (50 mg total) by mouth daily with breakfast for 1 day, THEN 4 tablets (40 mg total) daily with breakfast for 1 day, THEN 3 tablets (30 mg  total) daily with breakfast for 1 day, THEN 2 tablets (20 mg total) daily with breakfast for 1 day, THEN 1 tablet (10 mg total) daily with breakfast for 1 day. ?Start taking on: August 10, 2021 ?  ? ?  ? ? Follow-up Information   ? ? Virginia Crews, MD. Schedule an appointment as soon as possible for a visit.   ?Specialty: Family Medicine ?Contact information: ?[address] ?[address] ?[place] [address] ?[phone] ? ? ?  ?  ? ?  ?  ? ?  ? ?Discharge Exam: ?Filed Weights  ? 08/07/21 2216 08/08/21 0217  ?Weight: 81.6 kg 81.7 kg  ? ?General: Alert and oriented x3, no acute distress ?Cardiovascular: Regular rate and rhythm, S1-S2 ?Lungs: Mild end expiratory wheeze ? ?Condition at discharge: good ? ?The results of significant diagnostics from this hospitalization (including imaging, microbiology, ancillary and laboratory) are listed below for reference.  ? ?Imaging Studies: ?CT CHEST WO CONTRAST ? ?Result Date: 08/08/2021 ?CLINICAL DATA:  Worsening shortness of breath onset this morning. No improvement with albuterol inhaler. Hypoxic requiring 3 L supplemental oxygen. EXAM: CT CHEST WITHOUT CONTRAST TECHNIQUE: Multidetector CT imaging of the chest was performed following the standard protocol without IV contrast. RADIATION DOSE REDUCTION: This exam was performed according to the departmental dose-optimization program which includes automated exposure control, adjustment of the mA and/or kV according to patient size and/or use of iterative reconstruction technique. COMPARISON:  Portable chest today, PA Lat 09/28/2020. No prior cross-sectional imaging for comparison. FINDINGS: Cardiovascular: Cardiac size is normal. There is minimal pericardial effusion anteriorly. Scattered calcification LAD coronary artery. There is trace aortic atherosclerosis in the distal arch. There is no aortic aneurysm. Pulmonary veins are decompressed. The pulmonary arteries are normal caliber. There is normal great vessel branching.  Mediastinum/Nodes: There are shotty subcentimeter in short axis mediastinal lymph nodes but no bulky or encasing adenopathy. There is no thyroid or axillary mass. There is no esophageal thickening or hiatal hernia. The trachea is patent. Lungs/Pleura: Central bronchi are diffusely thickened. There are scattered small bronchial impactions in the bilateral lower lobe basal segments and in portions of the suprahilar areas of both upper lobes. There are mild paraseptal emphysematous changes in the lung apices. There are few linear scar-like opacities in the bases but no subpleural fibrosis. There is a focal ground-glass opacity in the posterior segment of the right upper lobe along the base of the upper lobe. There are no further focal infiltrates. There are no nodules. There is no pleural effusion, thickening or pneumothorax. Upper Abdomen: No acute abnormality. Musculoskeletal: No chest wall mass or suspicious bone lesions identified. IMPRESSION: 1. Scattered small bronchial impactions in both upper and both lower lobes, in keeping with small airways disease likely of other-than-aspiration etiology, with background central bronchitis. 2. Focal ground-glass infiltrate in the posterior base of the right upper lobe probably due to a small pneumonia. A follow-up study is recommended to ensure clearing. 3. Early COPD changes lung apices. No nodules or subpleural fibrosis. 4.  Single vessel calcific CAD, LAD coronary artery and minimal anterior pericardial fluid. 5. Trace aortic atherosclerosis.  No aneurysm. Electronically Signed   By: Telford Nab M.D.   On: 08/08/2021 07:22  ? ?DG Chest Portable 1 View ? ?Result Date: 08/07/2021 ?CLINICAL DATA:  Asthma. EXAM: PORTABLE CHEST 1 VIEW COMPARISON:  Sep 28, 2020 FINDINGS: The heart size and mediastinal contours are within normal limits. Both lungs are clear. The visualized skeletal structures are unremarkable. IMPRESSION: No active disease. Electronically Signed   By: Virgina Norfolk M.D.   On: 08/07/2021 22:55   ? ?Microbiology: ?Results for orders placed or performed during the hospital encounter of 08/07/21  ?Respiratory (~20 pathogens) panel by PCR     Status: None  ? Col

## 2022-02-10 ENCOUNTER — Emergency Department: Payer: 59

## 2022-02-10 ENCOUNTER — Encounter: Payer: Self-pay | Admitting: Emergency Medicine

## 2022-02-10 ENCOUNTER — Observation Stay
Admission: EM | Admit: 2022-02-10 | Discharge: 2022-02-11 | Disposition: A | Discharge status: Home / Self Care | Payer: 59 | Attending: Internal Medicine | Admitting: Internal Medicine

## 2022-02-10 ENCOUNTER — Other Ambulatory Visit: Payer: Self-pay

## 2022-02-10 DIAGNOSIS — J4521 Mild intermittent asthma with (acute) exacerbation: Principal | ICD-10-CM | POA: Insufficient documentation

## 2022-02-10 DIAGNOSIS — I10 Essential (primary) hypertension: Secondary | ICD-10-CM | POA: Diagnosis not present

## 2022-02-10 DIAGNOSIS — R0902 Hypoxemia: Secondary | ICD-10-CM

## 2022-02-10 DIAGNOSIS — Z79899 Other long term (current) drug therapy: Secondary | ICD-10-CM | POA: Diagnosis not present

## 2022-02-10 DIAGNOSIS — R0602 Shortness of breath: Secondary | ICD-10-CM

## 2022-02-10 DIAGNOSIS — Z20822 Contact with and (suspected) exposure to covid-19: Secondary | ICD-10-CM | POA: Diagnosis not present

## 2022-02-10 DIAGNOSIS — E876 Hypokalemia: Secondary | ICD-10-CM | POA: Insufficient documentation

## 2022-02-10 DIAGNOSIS — J9601 Acute respiratory failure with hypoxia: Secondary | ICD-10-CM | POA: Insufficient documentation

## 2022-02-10 DIAGNOSIS — J45901 Unspecified asthma with (acute) exacerbation: Secondary | ICD-10-CM | POA: Diagnosis not present

## 2022-02-10 LAB — RESPIRATORY PANEL BY PCR

## 2022-02-10 LAB — RESP PANEL BY RT-PCR (FLU A&B, COVID) ARPGX2
Influenza A by PCR: NEGATIVE
Influenza B by PCR: NEGATIVE
SARS Coronavirus 2 by RT PCR: NEGATIVE

## 2022-02-10 LAB — CBC
HCT: 53.3 % — ABNORMAL HIGH (ref 39.0–52.0)
Hemoglobin: 17.3 g/dL — ABNORMAL HIGH (ref 13.0–17.0)
MCH: 29.3 pg (ref 26.0–34.0)
MCHC: 32.5 g/dL (ref 30.0–36.0)
MCV: 90.3 fL (ref 80.0–100.0)
Platelets: 209 10*3/uL (ref 150–400)
RBC: 5.9 MIL/uL — ABNORMAL HIGH (ref 4.22–5.81)
RDW: 12.9 % (ref 11.5–15.5)
WBC: 6 10*3/uL (ref 4.0–10.5)
nRBC: 0 % (ref 0.0–0.2)

## 2022-02-10 LAB — TROPONIN I (HIGH SENSITIVITY)
Troponin I (High Sensitivity): 6 ng/L (ref <18)
Troponin I (High Sensitivity): 7 ng/L (ref <18)

## 2022-02-10 LAB — BASIC METABOLIC PANEL
Anion gap: 9 (ref 5–15)
BUN: 9 mg/dL (ref 6–20)
CO2: 24 mmol/L (ref 22–32)
Calcium: 9.1 mg/dL (ref 8.9–10.3)
Chloride: 107 mmol/L (ref 98–111)
Creatinine, Ser: 0.89 mg/dL (ref 0.61–1.24)
GFR, Estimated: >60 mL/min (ref >60)
Glucose, Bld: 116 mg/dL — ABNORMAL HIGH (ref 70–99)
Potassium: 3.4 mmol/L — ABNORMAL LOW (ref 3.5–5.1)
Sodium: 140 mmol/L (ref 135–145)

## 2022-02-10 LAB — MAGNESIUM: Magnesium: 2 mg/dL (ref 1.7–2.4)

## 2022-02-10 MED ORDER — ONDANSETRON HCL 4 MG PO TABS: 4.0000 mg | ORAL_TABLET | Freq: Four times a day (QID) | ORAL | Status: DC | PRN

## 2022-02-10 MED ORDER — ENOXAPARIN SODIUM 40 MG/0.4ML IJ SOSY
40.0000 mg | PREFILLED_SYRINGE | INTRAMUSCULAR | Status: DC
  Administered 2022-02-10: 40 mg via SUBCUTANEOUS
  Filled 2022-02-10 (×2): qty 0.4

## 2022-02-10 MED ORDER — IPRATROPIUM-ALBUTEROL 0.5-2.5 (3) MG/3ML IN SOLN: 3.0000 mL | Freq: Four times a day (QID) | RESPIRATORY_TRACT | Status: DC

## 2022-02-10 MED ORDER — CARVEDILOL 6.25 MG PO TABS
12.5000 mg | ORAL_TABLET | Freq: Two times a day (BID) | ORAL | Status: DC
  Administered 2022-02-10 – 2022-02-11 (×3): 12.5 mg via ORAL
  Filled 2022-02-10 (×3): qty 2

## 2022-02-10 MED ORDER — ENOXAPARIN SODIUM 40 MG/0.4ML IJ SOSY: 40.0000 mg | PREFILLED_SYRINGE | INTRAMUSCULAR | Status: DC

## 2022-02-10 MED ORDER — ALBUTEROL SULFATE (2.5 MG/3ML) 0.083% IN NEBU
2.5000 mg | INHALATION_SOLUTION | RESPIRATORY_TRACT | Status: DC | PRN
  Administered 2022-02-10: 2.5 mg via RESPIRATORY_TRACT
  Filled 2022-02-10: qty 3

## 2022-02-10 MED ORDER — METHYLPREDNISOLONE SODIUM SUCC 40 MG IJ SOLR
40.0000 mg | Freq: Two times a day (BID) | INTRAMUSCULAR | Status: AC
  Administered 2022-02-10 (×2): 40 mg via INTRAVENOUS
  Filled 2022-02-10 (×2): qty 1

## 2022-02-10 MED ORDER — METHYLPREDNISOLONE SODIUM SUCC 40 MG IJ SOLR: 40.0000 mg | Freq: Two times a day (BID) | INTRAMUSCULAR | Status: DC

## 2022-02-10 MED ORDER — HYDRALAZINE HCL 20 MG/ML IJ SOLN: 10.0000 mg | Freq: Four times a day (QID) | INTRAMUSCULAR | Status: DC | PRN

## 2022-02-10 MED ORDER — ACETAMINOPHEN 325 MG PO TABS: 650.0000 mg | ORAL_TABLET | Freq: Four times a day (QID) | ORAL | Status: DC | PRN

## 2022-02-10 MED ORDER — METHYLPREDNISOLONE SODIUM SUCC 125 MG IJ SOLR
125.0000 mg | Freq: Once | INTRAMUSCULAR | Status: AC
  Administered 2022-02-10: 125 mg via INTRAVENOUS
  Filled 2022-02-10: qty 2

## 2022-02-10 MED ORDER — HYDRALAZINE HCL 10 MG PO TABS
10.0000 mg | ORAL_TABLET | Freq: Three times a day (TID) | ORAL | Status: DC
  Filled 2022-02-10: qty 1

## 2022-02-10 MED ORDER — PREDNISONE 20 MG PO TABS: 40.0000 mg | ORAL_TABLET | Freq: Every day | ORAL | Status: DC

## 2022-02-10 MED ORDER — IPRATROPIUM-ALBUTEROL 0.5-2.5 (3) MG/3ML IN SOLN
6.0000 mL | Freq: Once | RESPIRATORY_TRACT | Status: AC
  Administered 2022-02-10: 6 mL via RESPIRATORY_TRACT
  Filled 2022-02-10: qty 3

## 2022-02-10 MED ORDER — PREDNISONE 20 MG PO TABS
40.0000 mg | ORAL_TABLET | Freq: Every day | ORAL | Status: DC
  Administered 2022-02-11: 40 mg via ORAL
  Filled 2022-02-10: qty 2

## 2022-02-10 MED ORDER — ALBUTEROL SULFATE (2.5 MG/3ML) 0.083% IN NEBU: 2.5000 mg | INHALATION_SOLUTION | Freq: Two times a day (BID) | RESPIRATORY_TRACT | Status: DC | PRN

## 2022-02-10 MED ORDER — ONDANSETRON HCL 4 MG/2ML IJ SOLN: 4.0000 mg | Freq: Four times a day (QID) | INTRAMUSCULAR | Status: DC | PRN

## 2022-02-10 MED ORDER — POTASSIUM CHLORIDE CRYS ER 20 MEQ PO TBCR
40.0000 meq | EXTENDED_RELEASE_TABLET | Freq: Once | ORAL | Status: AC
  Administered 2022-02-10: 40 meq via ORAL
  Filled 2022-02-10: qty 2

## 2022-02-10 MED ORDER — HYDRALAZINE HCL 50 MG PO TABS
25.0000 mg | ORAL_TABLET | Freq: Three times a day (TID) | ORAL | Status: DC
  Administered 2022-02-10 – 2022-02-11 (×3): 25 mg via ORAL
  Filled 2022-02-10 (×3): qty 1

## 2022-02-10 MED ORDER — IPRATROPIUM-ALBUTEROL 0.5-2.5 (3) MG/3ML IN SOLN
3.0000 mL | Freq: Once | RESPIRATORY_TRACT | Status: AC
  Administered 2022-02-10: 3 mL via RESPIRATORY_TRACT
  Filled 2022-02-10: qty 3

## 2022-02-10 MED ORDER — IPRATROPIUM-ALBUTEROL 0.5-2.5 (3) MG/3ML IN SOLN
3.0000 mL | Freq: Four times a day (QID) | RESPIRATORY_TRACT | Status: DC
  Administered 2022-02-10 – 2022-02-11 (×5): 3 mL via RESPIRATORY_TRACT
  Filled 2022-02-10 (×6): qty 3

## 2022-02-10 MED ORDER — ACETAMINOPHEN 650 MG RE SUPP: 650.0000 mg | Freq: Four times a day (QID) | RECTAL | Status: DC | PRN

## 2022-02-10 NOTE — H&P (Signed)
History and Physical    Patient: Ruben Gonzalez [id number] DOB: 1970-07-05 DOA: 02/10/2022 DOS: the patient was seen and examined on 02/10/2022 PCP: Pcp, No  Patient coming from: Home  Chief Complaint:  Chief Complaint  Patient presents with   Shortness of Breath   HPI: Ruben Gonzalez is a 51 y.o. male with medical history significant for asthma and hypertension who presents to the ER for evaluation of a 2-day history of shortness of breath and wheezing with poor response to his home nebulizer treatment.  Patient states that his symptoms initially started with congestion and rhinorrhea.  He also has a cough productive of greenish phlegm as well as chest tightness.Marland Kitchen  He denies having any sick contacts and denies having any fever or chills. He presented to the ER via EMS because his symptoms did not improve despite using his bronchodilators.  Per EMS he had room air pulse oximetry of 85% and was placed on 2 L of oxygen.  Prior to transport to the ED he received 1 DuoNeb by mask. He denies having any abdominal pain, no nausea, no vomiting, no urinary symptoms, no dizziness, no lightheadedness, no headache, no blurred vision, no focal deficit. He received multiple bronchodilator treatments in the ER but continued to have wheezing and remains short of breath. He will be referred to observation status for further evaluation.   Review of Systems: As mentioned in the history of present illness. All other systems reviewed and are negative. Past Medical History:  Diagnosis Date   Asthma    Hypertension    Past Surgical History:  Procedure Laterality Date   VASECTOMY     Social History:  reports that he has never smoked. He has never used smokeless tobacco. He reports current alcohol use. He reports that he does not currently use drugs after having used the following drugs: Marijuana.  No Known Allergies  Family History  Problem Relation Age of Onset   Heart attack Mother      Prior to Admission medications   Medication Sig Start Date End Date Taking? Authorizing Provider  albuterol (ACCUNEB) 1.25 MG/3ML nebulizer solution Inhale by nebulization 6 mLs (2.5 mg total) every 4 (four) hours as needed for wheezing or shortness of breath. 08/14/20 02/10/22 Yes Wieting, Richard, MD  albuterol (PROVENTIL) (2.5 MG/3ML) 0.083% nebulizer solution Take 2.5 mg by nebulization every 6 (six) hours as needed. 10/20/21  Yes [provider]  albuterol (VENTOLIN HFA) 108 (90 Base) MCG/ACT inhaler Inhale 2 puffs into the lungs every 6 (six) hours as needed for wheezing or shortness of breath. 08/10/21  Yes Annita Brod, MD  carvedilol (COREG) 12.5 MG tablet Take 1 tablet (12.5 mg total) by mouth 2 (two) times daily. 08/14/20  Yes Wieting, Richard, MD  hydrALAZINE (APRESOLINE) 10 MG tablet Take 1 tablet (10 mg total) by mouth every 8 (eight) hours. 08/10/21  Yes Annita Brod, MD    Physical Exam: Vitals:   02/10/22 0800 02/10/22 0830 02/10/22 0930 02/10/22 1121  BP: (!) 193/130 (!) 162/99 (!) 140/97 (!) 143/102  Pulse:  (!) 112 (!) 117   Resp:  16 (!) 22 18  Temp:      TempSrc:      SpO2:  95% 92% 92%  Weight:      Height:       Physical Exam Vitals and nursing note reviewed.  Constitutional:      Appearance: He is well-developed.     Comments: Has conversational dyspnea  HENT:  Head: Normocephalic and atraumatic.     Mouth/Throat:     Mouth: Mucous membranes are moist.  Eyes:     Pupils: Pupils are equal, round, and reactive to light.  Cardiovascular:     Rate and Rhythm: Tachycardia present.  Pulmonary:     Effort: Tachypnea present.     Breath sounds: Examination of the right-upper field reveals wheezing. Examination of the left-upper field reveals wheezing. Examination of the right-middle field reveals wheezing. Examination of the left-middle field reveals wheezing. Examination of the right-lower field reveals wheezing. Examination of the  left-lower field reveals wheezing. Wheezing present.  Abdominal:     General: Bowel sounds are normal.     Palpations: Abdomen is soft.  Musculoskeletal:        General: Normal range of motion.     Cervical back: Normal range of motion and neck supple.  Skin:    General: Skin is warm and dry.  Neurological:     General: No focal deficit present.     Mental Status: He is alert.  Psychiatric:        Mood and Affect: Mood normal.        Behavior: Behavior normal.     Data Reviewed: Relevant notes from primary care and specialist visits, past discharge summaries as available in EHR, including Care Everywhere. Prior diagnostic testing as pertinent to current admission diagnoses Updated medications and problem lists for reconciliation ED course, including vitals, labs, imaging, treatment and response to treatment Triage notes, nursing and pharmacy notes and ED provider's notes Notable results as noted in HPI Labs reviewed.  Troponin 7, sodium 140, potassium 3.4, chloride 107, bicarb 24, glucose 116, BUN 9, creatinine 0.89, calcium 9.1, white count 6.0, hemoglobin 17.3, hematocrit 53.3, platelet count 219 Respiratory viral panel is negative Chest x-ray reviewed by me shows No active cardiopulmonary disease. Twelve-lead EKG reviewed by me shows sinus tachycardia with nonspecific ST changes There are no new results to review at this time.  Assessment and Plan: * Acute exacerbation of extrinsic asthma Continue scheduled and as needed bronchodilator therapy Continue systemic steroids We will obtain respiratory viral panel Patient had room air pulse oximetry of 85% requiring oxygen supplementation at 2 L with improvement in his pulse oximetry to 94%. We will attempt to wean patient off oxygen once acute illness improves or resolves  Essential hypertension Continue carvedilol and hydralazine Titrate antihypertensive medications to optimize blood pressure control  Hypokalemia Supplement  potassium Check magnesium levels      Advance Care Planning:   Code Status: Full Code   Consults: None  Family Communication: Greater than 50% of time was spent discussing patient's condition and plan of care with him at the bedside.  All questions and concerns have been addressed.  He verbalizes understanding and agrees with the plan  Severity of Illness: The appropriate patient status for this patient is OBSERVATION. Observation status is judged to be reasonable and necessary in order to provide the required intensity of service to ensure the patient's safety. The patient's presenting symptoms, physical exam findings, and initial radiographic and laboratory data in the context of their medical condition is felt to place them at decreased risk for further clinical deterioration. Furthermore, it is anticipated that the patient will be medically stable for discharge from the hospital within 2 midnights of admission.   Author: Lucile Shutters, MD 02/10/2022 11:47 AM  For on call review [email or web address].

## 2022-02-10 NOTE — Assessment & Plan Note (Addendum)
Replaced into the normal range. 

## 2022-02-10 NOTE — Assessment & Plan Note (Addendum)
Patient is feeling better and wanted to go home.  Will prescribe prednisone for a few more days upon going home.  Prescribe steroid inhaler.  Refilled albuterol solution and albuterol inhaler

## 2022-02-10 NOTE — ED Triage Notes (Signed)
Pt to ED via EMS from home c/o SOB starting around midnight tonight.  States no specific pain but tightness throughout chest, productive yellow cough.  Used inhaler at home but has been out of solution for inhaler for a couple months.  EMS administered 1 duoneb and 1 albuterol, pt reports some relief.  Pt initially 85% with fire department on scene, 91% RA after EMS trx.  Pt A&Ox4, chest rise even and unlabored, skin WNL.

## 2022-02-10 NOTE — Progress Notes (Signed)
Patient admitted to unit from ER via bed in stable condition. Patient on RA. Ambulating independently with steady gait.

## 2022-02-10 NOTE — Assessment & Plan Note (Addendum)
Continue Coreg and increased dose of hydralazine.

## 2022-02-10 NOTE — ED Notes (Signed)
Pt reports increasing SHOB; 91% on 4l/min via [place]; diminished breath sounds, use of accessory muscles; acuity level changed and charge nurse notified of exam room needed

## 2022-02-10 NOTE — ED Provider Notes (Signed)
[facility] Provider Note    Event Date/Time   First MD Initiated Contact with Patient 02/10/22 [phone]     (approximate)   History   Shortness of Breath   HPI  Ruben Gonzalez is a 51 y.o. male who presents to the ED for evaluation of Shortness of Breath   I reviewed medical admission from March where he was admitted for asthma exacerbation. Also history of diastolic dysfunction.  Patient presents to the ED for 2 days of worsening shortness of breath.  He reports symptoms started with upper respiratory congestion and rhinorrhea, devolving into shortness of breath and wheezing over the past 1 day.  Reports using his albuterol inhaler to no avail.  Denies having a regular every day inhaler such as a inhaled corticosteroid.  No fevers or increased sputum production.  No pain.   Physical Exam   Triage Vital Signs: ED Triage Vitals  Enc Vitals Group     BP 02/10/22 0238 (!) 163/132     Pulse Rate 02/10/22 0238 (!) 127     Resp 02/10/22 0238 20     Temp 02/10/22 0238 99.3 F (37.4 C)     Temp Source 02/10/22 0238 Oral     SpO2 02/10/22 0238 (!) 85 %     Weight 02/10/22 0239 180 lb (81.6 kg)     Height 02/10/22 0239 5\' 9"  (1.753 m)     Head Circumference --      Peak Flow --      Pain Score 02/10/22 0239 6     Pain Loc --      Pain Edu? --      Excl. in GC? --     Most recent vital signs: Vitals:   02/10/22 0400 02/10/22 0430  BP: (!) 172/119 (!) 170/122  Pulse: (!) 126 (!) 119  Resp: (!) 24 17  Temp:    SpO2: 95% 94%    General: Awake, no distress.  Sitting upright and obviously tachypneic and wheezing.  Not quite tripoding CV:  Good peripheral perfusion.  Tachycardic and regular Resp:  Tachypneic to the mid/upper 20s.  Wheezing throughout with decreased airflow throughout. Abd:  No distention.  MSK:  No deformity noted.  Neuro:  No focal deficits appreciated. Other:     ED Results / Procedures / Treatments   Labs (all labs ordered  are listed, but only abnormal results are displayed) Labs Reviewed  BASIC METABOLIC PANEL - Abnormal; Notable for the following components:      Result Value   Potassium 3.4 (*)    Glucose, Bld 116 (*)    All other components within normal limits  CBC - Abnormal; Notable for the following components:   RBC 5.90 (*)    Hemoglobin 17.3 (*)    HCT 53.3 (*)    All other components within normal limits  RESP PANEL BY RT-PCR (FLU A&B, COVID) ARPGX2  HIV ANTIBODY (ROUTINE TESTING W REFLEX)  TROPONIN I (HIGH SENSITIVITY)  TROPONIN I (HIGH SENSITIVITY)    EKG Sinus tachycardia with a rate of 127 bpm.  Normal axis and intervals.  Nonspecific ST changes without clear ischemia  RADIOLOGY CXR interpreted by me without evidence of acute cardiopulmonary pathology.  Official radiology report(s): DG Chest 2 View  Result Date: 02/10/2022 CLINICAL DATA:  Shortness of breath starting at midnight tonight. Chest tightness. EXAM: CHEST - 2 VIEW COMPARISON:  08/07/2021 FINDINGS: The heart size and mediastinal contours are within normal limits. Both lungs are clear.  The visualized skeletal structures are unremarkable. IMPRESSION: No active cardiopulmonary disease. Electronically Signed   By: Lucienne Capers M.D.   On: 02/10/2022 03:13    PROCEDURES and INTERVENTIONS:  .1-3 Lead EKG Interpretation  Performed by: Vladimir Crofts, MD Authorized by: Vladimir Crofts, MD     Interpretation: abnormal     ECG rate:  110   ECG rate assessment: tachycardic     Rhythm: sinus tachycardia     Ectopy: none     Conduction: normal   .Critical Care  Performed by: Vladimir Crofts, MD Authorized by: Vladimir Crofts, MD   Critical care provider statement:    Critical care time (minutes):  30   Critical care time was exclusive of:  Separately billable procedures and treating other patients   Critical care was necessary to treat or prevent imminent or life-threatening deterioration of the following conditions:  Respiratory  failure   Critical care was time spent personally by me on the following activities:  Development of treatment plan with patient or surrogate, discussions with consultants, evaluation of patient's response to treatment, examination of patient, ordering and review of laboratory studies, ordering and review of radiographic studies, ordering and performing treatments and interventions, pulse oximetry, re-evaluation of patient's condition and review of old charts   Medications  enoxaparin (LOVENOX) injection 40 mg (has no administration in time range)  acetaminophen (TYLENOL) tablet 650 mg (has no administration in time range)    Or  acetaminophen (TYLENOL) suppository 650 mg (has no administration in time range)  ondansetron (ZOFRAN) tablet 4 mg (has no administration in time range)    Or  ondansetron (ZOFRAN) injection 4 mg (has no administration in time range)  methylPREDNISolone sodium succinate (SOLU-MEDROL) 40 mg/mL injection 40 mg (has no administration in time range)    Followed by  predniSONE (DELTASONE) tablet 40 mg (has no administration in time range)  ipratropium-albuterol (DUONEB) 0.5-2.5 (3) MG/3ML nebulizer solution 3 mL (has no administration in time range)  albuterol (PROVENTIL) (2.5 MG/3ML) 0.083% nebulizer solution 2.5 mg (has no administration in time range)  hydrALAZINE (APRESOLINE) injection 10 mg (has no administration in time range)  ipratropium-albuterol (DUONEB) 0.5-2.5 (3) MG/3ML nebulizer solution 3 mL (3 mLs Nebulization Given 02/10/22 0348)  methylPREDNISolone sodium succinate (SOLU-MEDROL) 125 mg/2 mL injection 125 mg (125 mg Intravenous Given 02/10/22 0348)  ipratropium-albuterol (DUONEB) 0.5-2.5 (3) MG/3ML nebulizer solution 6 mL (6 mLs Nebulization Given 02/10/22 0430)     IMPRESSION / MDM / [place] / ED COURSE  I reviewed the triage vital signs and the nursing notes.  Differential diagnosis includes, but is not limited to, pneumothorax,  pneumonia, asthma exacerbation, ACS, CHF exacerbation  {Patient presents with symptoms of an acute illness or injury that is potentially life-threatening.  Pleasant 51 year old male presents to the ED with evidence of hypoxia from asthma exacerbation requiring medical admission.  He is currently tachypneic and wheezing.  X-ray is clear.  Blood work is generally reassuring with essentially normal CBC, metabolic panel, troponin and flu/COVID testing.  Somewhat improving symptoms despite multiple rounds of breathing treatments and steroids, but still residual dyspnea and wheezing.  We will consult with medicine for admission.  Clinical Course as of 02/10/22 0514  Thu Feb 10, 2022  0425 Reassessed. Still wheezing. Discussed plan of care [DS]    Clinical Course User Index [DS] Vladimir Crofts, MD     FINAL CLINICAL IMPRESSION(S) / ED DIAGNOSES   Final diagnoses:  Hypoxia  Mild intermittent asthma with exacerbation  Shortness  of breath     Rx / DC Orders   ED Discharge Orders     None        Note:  This document was prepared using Dragon voice recognition software and may include unintentional dictation errors.   [place]ton [place], MD 02/10/22 [phone]

## 2022-02-11 DIAGNOSIS — J9601 Acute respiratory failure with hypoxia: Secondary | ICD-10-CM

## 2022-02-11 DIAGNOSIS — E876 Hypokalemia: Secondary | ICD-10-CM

## 2022-02-11 DIAGNOSIS — I10 Essential (primary) hypertension: Secondary | ICD-10-CM

## 2022-02-11 DIAGNOSIS — J45901 Unspecified asthma with (acute) exacerbation: Secondary | ICD-10-CM | POA: Diagnosis not present

## 2022-02-11 LAB — CBC
HCT: 50.6 % (ref 39.0–52.0)
Hemoglobin: 16.7 g/dL (ref 13.0–17.0)
MCH: 29.3 pg (ref 26.0–34.0)
MCHC: 33 g/dL (ref 30.0–36.0)
MCV: 88.9 fL (ref 80.0–100.0)
Platelets: 237 10*3/uL (ref 150–400)
RBC: 5.69 MIL/uL (ref 4.22–5.81)
RDW: 12.9 % (ref 11.5–15.5)
WBC: 12.9 10*3/uL — ABNORMAL HIGH (ref 4.0–10.5)
nRBC: 0 % (ref 0.0–0.2)

## 2022-02-11 LAB — BASIC METABOLIC PANEL
Anion gap: 9 (ref 5–15)
BUN: 16 mg/dL (ref 6–20)
CO2: 24 mmol/L (ref 22–32)
Calcium: 9.8 mg/dL (ref 8.9–10.3)
Chloride: 105 mmol/L (ref 98–111)
Creatinine, Ser: 0.83 mg/dL (ref 0.61–1.24)
GFR, Estimated: >60 mL/min (ref >60)
Glucose, Bld: 137 mg/dL — ABNORMAL HIGH (ref 70–99)
Potassium: 4 mmol/L (ref 3.5–5.1)
Sodium: 138 mmol/L (ref 135–145)

## 2022-02-11 LAB — HIV ANTIBODY (ROUTINE TESTING W REFLEX): HIV Screen 4th Generation wRfx: NONREACTIVE

## 2022-02-11 MED ORDER — HYDRALAZINE HCL 25 MG PO TABS: 25.0000 mg | ORAL_TABLET | Freq: Three times a day (TID) | ORAL | 0 refills | Status: DC

## 2022-02-11 MED ORDER — ALBUTEROL SULFATE HFA 108 (90 BASE) MCG/ACT IN AERS: 2.0000 | INHALATION_SPRAY | Freq: Four times a day (QID) | RESPIRATORY_TRACT | 0 refills | Status: DC | PRN

## 2022-02-11 MED ORDER — DULERA 100-5 MCG/ACT IN AERO: 2.0000 | INHALATION_SPRAY | Freq: Two times a day (BID) | RESPIRATORY_TRACT | 0 refills | Status: DC

## 2022-02-11 MED ORDER — PREDNISONE 20 MG PO TABS: 40.0000 mg | ORAL_TABLET | Freq: Every day | ORAL | 0 refills | Status: AC

## 2022-02-11 MED ORDER — ALBUTEROL SULFATE (2.5 MG/3ML) 0.083% IN NEBU: 2.5000 mg | INHALATION_SOLUTION | Freq: Four times a day (QID) | RESPIRATORY_TRACT | 0 refills | Status: DC | PRN

## 2022-02-11 NOTE — Progress Notes (Addendum)
Pt being discharged home, discharge instructions reviewed with pt, states understanding, pt with no complaints, waiting on ride 

## 2022-02-11 NOTE — Assessment & Plan Note (Signed)
Pulse ox of 85% on presentation.  Improved and now off oxygen with last saturation 95%.

## 2022-02-11 NOTE — Discharge Summary (Signed)
Physician Discharge Summary   Patient: Ruben Gonzalez MRN: [id number] DOB: Nov 06, 1970  Admit date:     02/10/2022  Discharge date: 02/11/22  Discharge Physician: Loletha Grayer   PCP: Pcp, No   Recommendations at discharge:   New PCP in a few weeks  Discharge Diagnoses: Principal Problem:   Acute exacerbation of extrinsic asthma Active Problems:   Acute hypoxic respiratory failure [facility])   Essential hypertension   Hypokalemia    Hospital Course: The patient was admitted to the hospital on 02/10/2022 and discharged on 02/11/2022.  Patient came in with asthma exacerbation.  COVID test and flu panel negative.  Positive for rhinovirus on respiratory panel.  Patient was given IV Solu-Medrol and switched over to prednisone.  Patient will be given a few more days of prednisone upon going home.  Patient was prescribed a steroid inhaler upon going home also.  Assessment and Plan: * Acute exacerbation of extrinsic asthma Patient is feeling better and wanted to go home.  Will prescribe prednisone for a few more days upon going home.  Prescribe steroid inhaler.  Refilled albuterol solution and albuterol inhaler  Acute hypoxic respiratory failure (HCC) Pulse ox of 85% on presentation.  Improved and now off oxygen with last saturation 95%.  Essential hypertension Continue Coreg and increased dose of hydralazine.  Hypokalemia Replaced into the normal range.         Consultants: None Procedures performed: None Disposition: Home Diet recommendation:  Cardiac diet DISCHARGE MEDICATION: Allergies as of 02/11/2022   No Known Allergies      Medication List     TAKE these medications    albuterol 108 (90 Base) MCG/ACT inhaler Commonly known as: VENTOLIN HFA Inhale 2 puffs into the lungs every 6 (six) hours as needed for wheezing or shortness of breath. What changed: Another medication with the same name was removed. Continue taking this medication, and follow the  directions you see here.   albuterol (2.5 MG/3ML) 0.083% nebulizer solution Commonly known as: PROVENTIL Take 3 mLs (2.5 mg total) by nebulization every 6 (six) hours as needed. What changed: Another medication with the same name was removed. Continue taking this medication, and follow the directions you see here.   carvedilol 12.5 MG tablet Commonly known as: COREG Take 1 tablet (12.5 mg total) by mouth 2 (two) times daily.   Dulera 100-5 MCG/ACT Aero Generic drug: mometasone-formoterol Inhale 2 puffs into the lungs 2 (two) times daily.   hydrALAZINE 25 MG tablet Commonly known as: APRESOLINE Take 1 tablet (25 mg total) by mouth every 8 (eight) hours. What changed:  medication strength how much to take   predniSONE 20 MG tablet Commonly known as: DELTASONE Take 2 tablets (40 mg total) by mouth daily with breakfast for 3 days.        Follow-up Information     Lavera Guise, MD. Go on 03/14/2022.   Specialties: Internal Medicine, Cardiology Why: @2 :30 new patient Contact information: [place] [address] [phone]                Discharge Exam: Filed Weights   02/10/22 0239  Weight: 81.6 kg   Physical Exam HENT:     Head: Normocephalic.     Mouth/Throat:     Pharynx: No oropharyngeal exudate.  Eyes:     General: Lids are normal.     Conjunctiva/sclera: Conjunctivae normal.  Cardiovascular:     Rate and Rhythm: Normal rate and regular rhythm.     Heart sounds:  Normal heart sounds, S1 normal and S2 normal.  Pulmonary:     Breath sounds: Examination of the right-lower field reveals decreased breath sounds and wheezing. Examination of the left-lower field reveals decreased breath sounds and wheezing. Decreased breath sounds and wheezing present. No rhonchi or rales.  Abdominal:     Palpations: Abdomen is soft.     Tenderness: There is no abdominal tenderness.  Musculoskeletal:     Right lower leg: No swelling.     Left lower leg: No  swelling.  Skin:    General: Skin is warm.     Findings: No rash.  Neurological:     Mental Status: He is alert and oriented to person, place, and time.      Condition at discharge: stable  The results of significant diagnostics from this hospitalization (including imaging, microbiology, ancillary and laboratory) are listed below for reference.   Imaging Studies: DG Chest 2 View  Result Date: 02/10/2022 CLINICAL DATA:  Shortness of breath starting at midnight tonight. Chest tightness. EXAM: CHEST - 2 VIEW COMPARISON:  08/07/2021 FINDINGS: The heart size and mediastinal contours are within normal limits. Both lungs are clear. The visualized skeletal structures are unremarkable. IMPRESSION: No active cardiopulmonary disease. Electronically Signed   By: Burman Nieves M.D.   On: 02/10/2022 03:13    Microbiology: Results for orders placed or performed during the hospital encounter of 02/10/22  Resp Panel by RT-PCR (Flu A&B, Covid) Anterior Nasal Swab     Status: None   Collection Time: 02/10/22  2:44 AM   Specimen: Anterior Nasal Swab  Result Value Ref Range Status   SARS Coronavirus 2 by RT PCR NEGATIVE NEGATIVE Final    Comment: (NOTE) SARS-CoV-2 target nucleic acids are NOT DETECTED.  The SARS-CoV-2 RNA is generally detectable in upper respiratory specimens during the acute phase of infection. The lowest concentration of SARS-CoV-2 viral copies this assay can detect is 138 copies/mL. A negative result does not preclude SARS-Cov-2 infection and should not be used as the sole basis for treatment or other patient management decisions. A negative result may occur with  improper specimen collection/handling, submission of specimen other than nasopharyngeal swab, presence of viral mutation(s) within the areas targeted by this assay, and inadequate number of viral copies(<138 copies/mL). A negative result must be combined with clinical observations, patient history, and  epidemiological information. The expected result is Negative.  Fact Sheet for Patients:  BloggerCourse.com  Fact Sheet for Healthcare Providers:  SeriousBroker.it  This test is no t yet approved or cleared by the [place] FDA and  has been authorized for detection and/or diagnosis of SARS-CoV-2 by FDA under an Emergency Use Authorization (EUA). This EUA will remain  in effect (meaning this test can be used) for the duration of the COVID-19 declaration under Section 564(b)(1) of the Act, 21 U.S.C.section 360bbb-3(b)(1), unless the authorization is terminated  or revoked sooner.       Influenza A by PCR NEGATIVE NEGATIVE Final   Influenza B by PCR NEGATIVE NEGATIVE Final    Comment: (NOTE) The Xpert Xpress SARS-CoV-2/FLU/RSV plus assay is intended as an aid in the diagnosis of influenza from Nasopharyngeal swab specimens and should not be used as a sole basis for treatment. Nasal washings and aspirates are unacceptable for Xpert Xpress SARS-CoV-2/FLU/RSV testing.  Fact Sheet for Patients: BloggerCourse.com  Fact Sheet for Healthcare Providers: SeriousBroker.it  This test is not yet approved or cleared by the [place] FDA and has been authorized for detection and/or  diagnosis of SARS-CoV-2 by FDA under an Emergency Use Authorization (EUA). This EUA will remain in effect (meaning this test can be used) for the duration of the COVID-19 declaration under Section 564(b)(1) of the Act, 21 U.S.C. section 360bbb-3(b)(1), unless the authorization is terminated or revoked.  Performed at [facility], [address] [place], [place] [address]   Respiratory (~20 pathogens) panel by PCR     Status: Abnormal   Collection Time: 02/10/22 11:32 AM   Specimen: Nasopharyngeal Swab; Respiratory  Result Value Ref Range Status   Adenovirus NOT DETECTED NOT DETECTED Final    Coronavirus 229E NOT DETECTED NOT DETECTED Final    Comment: (NOTE) The Coronavirus on the Respiratory Panel, DOES NOT test for the novel  Coronavirus (2019 nCoV)    Coronavirus HKU1 NOT DETECTED NOT DETECTED Final   Coronavirus NL63 NOT DETECTED NOT DETECTED Final   Coronavirus OC43 NOT DETECTED NOT DETECTED Final   Metapneumovirus NOT DETECTED NOT DETECTED Final   Rhinovirus / Enterovirus DETECTED (A) NOT DETECTED Final   Influenza A NOT DETECTED NOT DETECTED Final   Influenza B NOT DETECTED NOT DETECTED Final   Parainfluenza Virus 1 NOT DETECTED NOT DETECTED Final   Parainfluenza Virus 2 NOT DETECTED NOT DETECTED Final   Parainfluenza Virus 3 NOT DETECTED NOT DETECTED Final   Parainfluenza Virus 4 NOT DETECTED NOT DETECTED Final   Respiratory Syncytial Virus NOT DETECTED NOT DETECTED Final   Bordetella pertussis NOT DETECTED NOT DETECTED Final   Bordetella Parapertussis NOT DETECTED NOT DETECTED Final   Chlamydophila pneumoniae NOT DETECTED NOT DETECTED Final   Mycoplasma pneumoniae NOT DETECTED NOT DETECTED Final    Comment: Performed at [facility], [address], [place], [place] [address]    Labs: CBC: Recent Labs  Lab 02/10/22 0244 02/11/22 0619  WBC 6.0 12.9*  HGB 17.3* 16.7  HCT 53.3* 50.6  MCV 90.3 88.9  PLT 209 237   Basic Metabolic Panel: Recent Labs  Lab 02/10/22 0244 02/10/22 0253 02/11/22 0619  NA 140  --  138  K 3.4*  --  4.0  CL 107  --  105  CO2 24  --  24  GLUCOSE 116*  --  137*  BUN 9  --  16  CREATININE 0.89  --  0.83  CALCIUM 9.1  --  9.8  MG  --  2.0  --      Discharge time spent: greater than 30 minutes.  Signed: Alford Highland, MD [facility] 02/11/2022

## 2022-03-14 ENCOUNTER — Ambulatory Visit: Payer: 59 | Admitting: Internal Medicine

## 2022-03-17 ENCOUNTER — Encounter: Payer: Self-pay | Admitting: Physician Assistant

## 2022-03-17 ENCOUNTER — Ambulatory Visit (INDEPENDENT_AMBULATORY_CARE_PROVIDER_SITE_OTHER): Payer: 59 | Admitting: Physician Assistant

## 2022-03-17 VITALS — BP 114/86 | HR 69 | Temp 98.2°F | Resp 16 | Ht 70.0 in | Wt 170.0 lb

## 2022-03-17 DIAGNOSIS — I10 Essential (primary) hypertension: Secondary | ICD-10-CM

## 2022-03-17 DIAGNOSIS — R739 Hyperglycemia, unspecified: Secondary | ICD-10-CM | POA: Diagnosis not present

## 2022-03-17 DIAGNOSIS — Z09 Encounter for follow-up examination after completed treatment for conditions other than malignant neoplasm: Secondary | ICD-10-CM

## 2022-03-17 DIAGNOSIS — F1010 Alcohol abuse, uncomplicated: Secondary | ICD-10-CM

## 2022-03-17 DIAGNOSIS — I5032 Chronic diastolic (congestive) heart failure: Secondary | ICD-10-CM | POA: Diagnosis not present

## 2022-03-17 DIAGNOSIS — J45909 Unspecified asthma, uncomplicated: Secondary | ICD-10-CM

## 2022-03-17 DIAGNOSIS — E876 Hypokalemia: Secondary | ICD-10-CM

## 2022-03-17 DIAGNOSIS — Z7689 Persons encountering health services in other specified circumstances: Secondary | ICD-10-CM

## 2022-03-17 DIAGNOSIS — I5033 Acute on chronic diastolic (congestive) heart failure: Secondary | ICD-10-CM

## 2022-03-17 DIAGNOSIS — E663 Overweight: Secondary | ICD-10-CM

## 2022-03-17 DIAGNOSIS — J45901 Unspecified asthma with (acute) exacerbation: Secondary | ICD-10-CM

## 2022-03-17 LAB — POCT GLYCOSYLATED HEMOGLOBIN (HGB A1C)
Est. average glucose Bld gHb Est-mCnc: 114
Hemoglobin A1C: 5.6 % (ref 4.0–5.6)

## 2022-03-17 MED ORDER — DULERA 100-5 MCG/ACT IN AERO: 2.0000 | INHALATION_SPRAY | Freq: Two times a day (BID) | RESPIRATORY_TRACT | 0 refills | Status: DC

## 2022-03-17 MED ORDER — ALBUTEROL SULFATE (2.5 MG/3ML) 0.083% IN NEBU: 2.5000 mg | INHALATION_SOLUTION | Freq: Four times a day (QID) | RESPIRATORY_TRACT | 0 refills | Status: DC | PRN

## 2022-03-17 MED ORDER — ALBUTEROL SULFATE HFA 108 (90 BASE) MCG/ACT IN AERS: 2.0000 | INHALATION_SPRAY | Freq: Four times a day (QID) | RESPIRATORY_TRACT | 0 refills | Status: DC | PRN

## 2022-03-17 NOTE — Progress Notes (Signed)
I,Roshena L Chambers,acting as a Neurosurgeon for [employer], PA-C.,have documented all relevant documentation on the behalf of [name], PA-C,as directed by  [employer], PA-C while in the presence of [employer], PA-C.    New patient visit   Patient: Ruben Gonzalez   DOB: [birth year]   51 y.o. Male  MRN: [id number] Visit Date: 03/17/2022  Today's healthcare provider: [name], PA-C   Chief Complaint  Patient presents with   Establish Care   Subjective    Ruben Gonzalez is a 51 y.o. male who presents today as a new patient to establish care.   HPI  Lives in [place]. His previous physician practices in [place]. He wants to find a PCP in [place] due to transportation problems. Works for a [employer]. Has been having financial problems. Requests to fill out a clearance form for donating blood/twice weekly for a monetary reward.  Pt reports that this is a seasonal problem. Currently, he does not have any symptoms Currently, denies having runny nose, nasal congestion, nasal itching, eye itching or discharge, postnasal drip Has been having asthma X 53 years , "all year round." Has been using inhalers. Recent exacerbation was in October and he was hospitalized in April of this year.  Has HTN. Takes coreg and hydralazine.   BP Readings from Last 3 Encounters:  03/17/22 114/86  02/11/22 137/86  08/10/21 (!) 161/111    Weight trend: decreasing steadily Wt Readings from Last 3 Encounters:  03/17/22 170 lb (77.1 kg)  02/10/22 180 lb (81.6 kg)  08/08/21 180 lb 1.6 oz (81.7 kg)    Current diet: general diet  ------------------------------------------------------------------------  Past Medical History:  Diagnosis Date   Allergies    Asthma    Hypertension    Past Surgical History:  Procedure Laterality Date   VASECTOMY     Family Status  Relation Name Status   Mother  Alive   Father  Alive   Family History  Problem Relation Age of  Onset   Heart attack Mother    Allergies Mother    Social History   Socioeconomic History   Marital status: Single    Spouse name: Not on file   Number of children: Not on file   Years of education: Not on file   Highest education level: Not on file  Occupational History   Occupation: works for [employer]  Tobacco Use   Smoking status: Never   Smokeless tobacco: Never  Vaping Use   Vaping Use: Never used  Substance and Sexual Activity   Alcohol use: Yes    Alcohol/week: 14.0 standard drinks of alcohol    Types: 6 Cans of beer, 8 Shots of liquor per week   Drug use: Not Currently    Types: Marijuana   Sexual activity: Yes  Other Topics Concern   Not on file  Social History Narrative   Not on file   Social Determinants of Health   Financial Resource Strain: Not on file  Food Insecurity: No Food Insecurity (02/10/2022)   Hunger Vital Sign    Worried About Running Out of Food in the Last Year: Never true    Ran Out of Food in the Last Year: Never true  Transportation Needs: No Transportation Needs (02/10/2022)   PRAPARE - Administrator, Civil Service (Medical): No    Lack of Transportation (Non-Medical): No  Physical Activity: Not on file  Stress: Not on file  Social Connections: Not on file   Outpatient  Medications Prior to Visit  Medication Sig   carvedilol (COREG) 12.5 MG tablet Take 1 tablet (12.5 mg total) by mouth 2 (two) times daily.   hydrALAZINE (APRESOLINE) 10 MG tablet Take 10 mg by mouth every 8 (eight) hours.   [DISCONTINUED] albuterol (PROVENTIL) (2.5 MG/3ML) 0.083% nebulizer solution Take 3 mLs (2.5 mg total) by nebulization every 6 (six) hours as needed.   [DISCONTINUED] albuterol (VENTOLIN HFA) 108 (90 Base) MCG/ACT inhaler Inhale 2 puffs into the lungs every 6 (six) hours as needed for wheezing or shortness of breath.   [DISCONTINUED] mometasone-formoterol (DULERA) 100-5 MCG/ACT AERO Inhale 2 puffs into the lungs 2 (two) times daily.    [DISCONTINUED] hydrALAZINE (APRESOLINE) 25 MG tablet Take 1 tablet (25 mg total) by mouth every 8 (eight) hours.   No facility-administered medications prior to visit.   No Known Allergies  There is no immunization history for the selected administration types on file for this patient.  Health Maintenance  Topic Date Due   COVID-19 Vaccine (1) Never done   Hepatitis C Screening  Never done   TETANUS/TDAP  Never done   COLONOSCOPY (Pts 45-71yrs Insurance coverage will need to be confirmed)  Never done   Zoster Vaccines- Shingrix (1 of 2) Never done   INFLUENZA VACCINE  Never done   HIV Screening  Completed   HPV VACCINES  Aged Out    Patient Care Team: Pcp, No as PCP - General  Review of Systems  Constitutional:  Negative for appetite change, chills, fatigue and fever.  HENT:  Negative for congestion, ear pain, hearing loss, nosebleeds and trouble swallowing.   Eyes:  Negative for pain and visual disturbance.  Respiratory:  Negative for cough, chest tightness and shortness of breath.   Cardiovascular:  Negative for chest pain, palpitations and leg swelling.  Gastrointestinal:  Negative for abdominal pain, blood in stool, constipation, diarrhea, nausea and vomiting.  Endocrine: Negative for polydipsia, polyphagia and polyuria.  Genitourinary:  Negative for dysuria and flank pain.  Musculoskeletal:  Negative for arthralgias, back pain, joint swelling, myalgias and neck stiffness.  Skin:  Negative for color change, rash and wound.  Neurological:  Negative for dizziness, tremors, seizures, speech difficulty, weakness, light-headedness and headaches.  Psychiatric/Behavioral:  Negative for behavioral problems, confusion, decreased concentration, dysphoric mood and sleep disturbance. The patient is not nervous/anxious.   All other systems reviewed and are negative.      Objective    BP 114/86 (BP Location: Right Arm, Patient Position: Sitting, Cuff Size: Normal)   Pulse 69   Temp  98.2 F (36.8 C) (Oral)   Resp 16   Ht 5\' 10"  (1.778 m)   Wt 170 lb (77.1 kg)   SpO2 97% Comment: room air  BMI 24.39 kg/m    Today's Vitals   03/17/22 1339 03/17/22 1343  BP: (!) 120/94 114/86  Pulse: 80 69  Resp: 16   Temp: 98.2 F (36.8 C)   TempSrc: Oral   SpO2: 97%   Weight: 170 lb (77.1 kg)   Height: 5\' 10"  (1.778 m)    Body mass index is 24.39 kg/m.   Physical Exam Vitals reviewed.  Constitutional:      General: He is not in acute distress.    Appearance: Normal appearance. He is not diaphoretic.  HENT:     Head: Normocephalic and atraumatic.     Nose: Rhinorrhea present.  Eyes:     General: No scleral icterus.    Extraocular Movements: Extraocular movements intact.  Conjunctiva/sclera: Conjunctivae normal.     Pupils: Pupils are equal, round, and reactive to light.  Cardiovascular:     Rate and Rhythm: Normal rate and regular rhythm.     Pulses: Normal pulses.     Heart sounds: Normal heart sounds. No murmur heard. Pulmonary:     Effort: Pulmonary effort is normal. No respiratory distress.     Breath sounds: Normal breath sounds. No wheezing or rhonchi.  Abdominal:     General: Abdomen is flat. Bowel sounds are normal.     Palpations: Abdomen is soft.  Musculoskeletal:        General: Normal range of motion.     Cervical back: Normal range of motion and neck supple.     Right lower leg: No edema.     Left lower leg: No edema.  Lymphadenopathy:     Cervical: No cervical adenopathy.  Skin:    General: Skin is warm and dry.     Findings: No rash.  Neurological:     Mental Status: He is alert and oriented to person, place, and time. Mental status is at baseline.  Psychiatric:        Mood and Affect: Mood normal.        Behavior: Behavior normal.        Thought Content: Thought content normal.        Judgment: Judgment normal.      Depression Screen    03/17/2022    1:33 PM  PHQ 2/9 Scores  PHQ - 2 Score 0   No results found for any  visits on 03/17/22.  Assessment & Plan      Acute exacerbation of extrinsic asthma Chronic. Stable Normal PE, Asymptomatic Continue his current regimen: - albuterol (PROVENTIL) (2.5 MG/3ML) 0.083% nebulizer solution; Take 3 mLs (2.5 mg total) by nebulization every 6 (six) hours as needed.  Dispense: 75 mL; Refill: 0 - albuterol (VENTOLIN HFA) 108 (90 Base) MCG/ACT inhaler; Inhale 2 puffs into the lungs every 6 (six) hours as needed for wheezing or shortness of breath.  Dispense: 18 g; Refill: 0 - mometasone-formoterol (DULERA) 100-5 MCG/ACT AERO; Inhale 2 puffs into the lungs 2 (two) times daily.  Dispense: 1 each; Refill: 0 - Ambulatory referral to Allergy for assessment  Per chart review: Was admitted to the hospital on 02/10/2022 for asthma exacerbation, acute hypoxic respiratory failure with oxygen saturation of 85% on presentation and hypokalemia. Was discharged on 02/11/2022.  He was positive for rhinovirus on respiratory panel, was giving IV Solu-Medrol.  Was discharged on prednisone and a steroid inhaler, albuterol solution and albuterol inhaler.  Was admitted to hospital on 08/07/2021 for asthma exacerbation, acute respiratory failure with hypoxia, acute on chronic diastolic see HF, hyperglycemia, overweight, hypertension.  Will discharge on 08/10/2021.  Was discharged on steroids, Coreg 12. 5 mg twice daily, started/ hydralazine.  Did not have PCP at the time.  Essential hypertension Chronic and stable? His BP today was 120/94.  Was high at the blood donation center recently Questioning his compliance with his current BP meds: His coreg was dispensed in April with a half of his pillbox still full His  was dispensed in May Encouraged to comply with his current medication regimen Continue carvedilol 12.5 mg bid and hydralazine 25 mg tid Will recheck in a week/for compliance with medications and BP monitoring   Chronic diastolic CHF  Chronic and stable Normal vitals, normal PE, no  sob, no cp, no palpitations - Lipid Profile Continue  Will  FU  Overweight (BMI 25.0-29.9) Improved. Lost 10 pounds since the last documented weight on 101/2023 - Lipid Profile Continue with healthy diet and daily exercise as tolerated Will monitor  Hyperglycemia Chronic and stable POCT A1C today was 5.6 Continue lifestyle modifications Pt lost 10 pounds since 01/2022. Will reassess at his next FU   Encounter to establish care He decided to find a PCP close to his current residence Pt was requested to obtain/ or permit to release his old records from his previous PCP and allergist from [facility] Pt has a hx of alcohol abuse, marijuana abuse. Denies at the present. Needs  drug test.  Return in about 10 days (around 03/27/2022) for chronic disease f/u.     The patient was advised to call back or seek an in-person evaluation if the symptoms worsen or if the condition fails to improve as anticipated.  I discussed the assessment and treatment plan with the patient. The patient was provided an opportunity to ask questions and all were answered. The patient agreed with the plan and demonstrated an understanding of the instructions.  The entirety of the information documented in the History of Present Illness, Review of Systems and Physical Exam were personally obtained by me. Portions of this information were initially documented by the CMA and reviewed by me for thoroughness and accuracy.    [name], [facility], MMS [facility] [phone] (phone) [phone] (fax)

## 2022-03-22 ENCOUNTER — Ambulatory Visit (INDEPENDENT_AMBULATORY_CARE_PROVIDER_SITE_OTHER): Payer: 59 | Admitting: Physician Assistant

## 2022-03-22 ENCOUNTER — Encounter: Payer: Self-pay | Admitting: Physician Assistant

## 2022-03-22 VITALS — BP 149/95 | HR 70 | Temp 98.7°F | Resp 16 | Wt 174.3 lb

## 2022-03-22 DIAGNOSIS — I10 Essential (primary) hypertension: Secondary | ICD-10-CM

## 2022-03-22 DIAGNOSIS — Z23 Encounter for immunization: Secondary | ICD-10-CM

## 2022-03-22 NOTE — Progress Notes (Unsigned)
I,[name],acting as a Neurosurgeon for [employer], PA-C.,have documented all relevant documentation on the behalf of Ruben Lat, PA-C,as directed by  [employer], PA-C while in the presence of [employer], PA-C.     Established patient visit   Patient: Ruben Gonzalez   DOB: 04-08-1971   51 y.o. Male  MRN: [id number] Visit Date: 03/22/2022  Today's healthcare provider: [name], PA-C   Chief Complaint  Patient presents with   Follow-up   Subjective    HPI  Follow up for hypertension  The patient was last seen for this 1 weeks ago. Changes made at last visit include no changes. Patient encouraged to comply with his current medications. Continue carvedilol 12.5 mg BID and hydralazine 25 mg TID.  However, pt seems taking carvedilol BID but hydralazine once daily. He reports excellent compliance with treatment. He feels that condition is Improved. He is not having side effects.  Patient reports not checking BP at home. Patient reports checking BP at [facility], reports 130/87.  Was seen at ED on 01/2022 with asthma exacerbation and bilateral leg edema Chest XR showed no active cardiopulmonary disease. Per chart review, was seen at ED on 08/07/21 with asthma exacerbation and CHF?   Medications: Outpatient Medications Prior to Visit  Medication Sig   albuterol (PROVENTIL) (2.5 MG/3ML) 0.083% nebulizer solution Take 3 mLs (2.5 mg total) by nebulization every 6 (six) hours as needed.   albuterol (VENTOLIN HFA) 108 (90 Base) MCG/ACT inhaler Inhale 2 puffs into the lungs every 6 (six) hours as needed for wheezing or shortness of breath.   carvedilol (COREG) 12.5 MG tablet Take 1 tablet (12.5 mg total) by mouth 2 (two) times daily.   hydrALAZINE (APRESOLINE) 10 MG tablet Take 10 mg by mouth every 8 (eight) hours.   mometasone-formoterol (DULERA) 100-5 MCG/ACT AERO Inhale 2 puffs into the lungs 2 (two) times daily.   No facility-administered medications prior to visit.     Review of Systems  Constitutional:  Negative for appetite change and fatigue.  Eyes:  Negative for visual disturbance.  Respiratory:  Negative for cough, chest tightness and shortness of breath.   Cardiovascular:  Negative for chest pain and leg swelling.  Gastrointestinal:  Negative for abdominal pain, nausea and vomiting.  Neurological:  Negative for dizziness, light-headedness and headaches.    Last metabolic panel Lab Results  Component Value Date   GLUCOSE 137 (H) 02/11/2022   NA 138 02/11/2022   K 4.0 02/11/2022   CL 105 02/11/2022   CO2 24 02/11/2022   BUN 16 02/11/2022   CREATININE 0.83 02/11/2022   GFRNONAA >60 02/11/2022   CALCIUM 9.8 02/11/2022   PHOS 2.8 08/08/2021   PROT 8.2 (H) 08/08/2021   ALBUMIN 4.4 08/08/2021   BILITOT 1.5 (H) 08/08/2021   ALKPHOS 62 08/08/2021   AST 23 08/08/2021   ALT 19 08/08/2021   ANIONGAP 9 02/11/2022   Last lipids No results found for: "CHOL", "HDL", "LDLCALC", "LDLDIRECT", "TRIG", "CHOLHDL" Last hemoglobin A1c Lab Results  Component Value Date   HGBA1C 5.6 03/17/2022   Last thyroid functions Lab Results  Component Value Date   TSH 1.028 08/08/2021       Objective    BP (!) 149/95 (BP Location: Left Arm, Cuff Size: Large)   Pulse 70   Temp 98.7 F (37.1 C) (Oral)   Resp 16   Wt 174 lb 4.8 oz (79.1 kg)   SpO2 99%   BMI 25.01 kg/m  BP Readings from Last  3 Encounters:  03/22/22 (!) 149/95  03/17/22 114/86  02/11/22 137/86   Wt Readings from Last 3 Encounters:  03/22/22 174 lb 4.8 oz (79.1 kg)  03/17/22 170 lb (77.1 kg)  02/10/22 180 lb (81.6 kg)    Physical Exam Vitals reviewed.  Constitutional:      General: He is not in acute distress.    Appearance: Normal appearance. He is not diaphoretic.  HENT:     Head: Normocephalic and atraumatic.  Eyes:     General: No scleral icterus.    Extraocular Movements: Extraocular movements intact.     Conjunctiva/sclera: Conjunctivae normal.     Pupils: Pupils  are equal, round, and reactive to light.  Cardiovascular:     Rate and Rhythm: Normal rate and regular rhythm.     Pulses: Normal pulses.     Heart sounds: Normal heart sounds. No murmur heard. Pulmonary:     Effort: Pulmonary effort is normal. No respiratory distress.     Breath sounds: Normal breath sounds. No wheezing or rhonchi.  Musculoskeletal:        General: Normal range of motion.     Cervical back: Normal range of motion and neck supple.     Right lower leg: No edema.     Left lower leg: No edema.  Lymphadenopathy:     Cervical: No cervical adenopathy.  Skin:    General: Skin is warm and dry.     Findings: No rash.  Neurological:     Mental Status: He is alert and oriented to person, place, and time. Mental status is at baseline.  Psychiatric:        Behavior: Behavior normal.        Thought Content: Thought content normal.        Judgment: Judgment normal.      No results found for any visits on 03/22/22.  Assessment & Plan     1. Essential hypertension Chronic but not stable His BP today increased to 170/108 by the end of visit Advised to take coreg 12.5 mg BID and hydralazine 10mg  TID/pt took once a day Pt was requested to contact me on On Monday, November 27th with BP status. Pt will need to increase the dose of medications if BP will not return to normal. Will recheck in a week  Per chart review from 08/10/21, pt was seen for acute on chronic diastolic CHF. "Grade 1 diastolic dysfunction seen on echocardiogram in 2017. "  BNP was normal , was treated with multiple doses of Lasix.  Paper work could not be filled unless his BP will stabilize Pt needs Blood work. He declined today and agreed to do labwork on Friday. Has hx of alcohol&marijuana abuse  2. Need for influenza vaccination  - Flu Vaccine QUAD 6+ mos PF IM (Fluarix Quad PF)  Pt situation regarding CHF is unclear In 2017 , he had diastolic dysfunction on echo. His BNP in 07/2021 was WNL.   Considering a referral to cardiology for evaluation and imaging  Received the records from a research facility at [facility] for asthma studies.  Per chare review from 10/15/21 she was advised to continue with AirDuo 113/14 mcg 1 p BID and Albuterol was called into [facility],  [place]  FU in 1 week The patient was advised to call back or seek an in-person evaluation if the symptoms worsen or if the condition fails to improve as anticipated.  I discussed the assessment and treatment plan with the patient. The patient was  provided an opportunity to ask questions and all were answered. The patient agreed with the plan and demonstrated an understanding of the instructions.  The entirety of the information documented in the History of Present Illness, Review of Systems and Physical Exam were personally obtained by me. Portions of this information were initially documented by the CMA and reviewed by me for thoroughness and accuracy.   Ruben Gonzalez, [facility], MMS [facility] [phone] (phone) [phone] (fax)

## 2022-04-01 ENCOUNTER — Ambulatory Visit: Payer: 59 | Admitting: Physician Assistant

## 2022-04-05 ENCOUNTER — Ambulatory Visit: Payer: 59 | Admitting: Nurse Practitioner

## 2022-05-03 ENCOUNTER — Other Ambulatory Visit: Payer: Self-pay | Admitting: Physician Assistant

## 2022-05-03 DIAGNOSIS — J45909 Unspecified asthma, uncomplicated: Secondary | ICD-10-CM

## 2022-05-04 ENCOUNTER — Telehealth: Payer: Self-pay | Admitting: Physician Assistant

## 2022-05-04 NOTE — Telephone Encounter (Signed)
Requested Prescriptions  Pending Prescriptions Disp Refills   albuterol (VENTOLIN HFA) 108 (90 Base) MCG/ACT inhaler [Pharmacy Med Name: Albuterol Sulfate HFA 108 (90 Base) MCG/ACT Inhalation Aerosol Solution] 9 g 0    Sig: INHALE 2 PUFFS BY MOUTH EVERY 6 HOURS AS NEEDED FOR WHEEZING FOR SHORTNESS OF BREATH     Pulmonology:  Beta Agonists 2 Failed - 05/03/2022  9:42 AM      Failed - Last BP in normal range    BP Readings from Last 1 Encounters:  03/22/22 (!) 149/95         Passed - Last Heart Rate in normal range    Pulse Readings from Last 1 Encounters:  03/22/22 70         Passed - Valid encounter within last 12 months    Recent Outpatient Visits           1 month ago Essential hypertension   [employer], [place], PA-C   1 month ago Essential hypertension   [facility], [place], PA-C               [place] 100-5 MCG/ACT AERO [Pharmacy Med Name: Ruthe Mannan 100-5 MCG/ACT Inhalation Aerosol] 13 g 0    Sig: Inhale 2 puffs by mouth twice daily     Pulmonology:  Combination Products Passed - 05/03/2022  9:42 AM      Passed - Valid encounter within last 12 months    Recent Outpatient Visits           1 month ago Essential hypertension   [employer], [place], PA-C   1 month ago Essential hypertension   [employer], [place], [place]

## 2022-05-04 NOTE — Telephone Encounter (Signed)
Medication Refill - Medication:   Disp Refills Start End   hydrALAZINE (APRESOLINE) 10 MG tablet         Has the patient contacted their pharmacy? Yes.   (Agent: If no, request that the patient contact the pharmacy for the refill. If patient does not wish to contact the pharmacy document the reason why and proceed with request.) Pt states asked for refill, pt was to have a 1 wk fu on 12/1 and cancelled, states transportation difficult for him and out of any BP med (Agent: If yes, when and what did the pharmacy advise?) call dr  Preferred Pharmacy (with phone number or street name):   [place] ([facility]), [place] [address] (Ph: [phone])   Has the patient been seen for an appointment in the last year OR does the patient have an upcoming appointment? Yes.    Agent: Please be advised that RX refills may take up to 3 business days. We ask that you follow-up with your pharmacy.

## 2022-05-05 ENCOUNTER — Other Ambulatory Visit: Payer: Self-pay | Admitting: Physician Assistant

## 2022-05-05 ENCOUNTER — Other Ambulatory Visit: Payer: Self-pay

## 2022-05-05 MED ORDER — HYDRALAZINE HCL 10 MG PO TABS
10.0000 mg | ORAL_TABLET | Freq: Three times a day (TID) | ORAL | 0 refills | Status: DC
  Filled 2022-05-05: qty 90, 30d supply, fill #0

## 2022-05-05 NOTE — Telephone Encounter (Signed)
Call could not be completed as dialed

## 2022-05-05 NOTE — Telephone Encounter (Signed)
Medication request:  LOV - 03/22/22 NOV - None Last Refill - Historical

## 2022-05-13 ENCOUNTER — Other Ambulatory Visit: Payer: Self-pay

## 2022-06-06 ENCOUNTER — Other Ambulatory Visit: Payer: Self-pay | Admitting: Physician Assistant

## 2022-06-06 DIAGNOSIS — J45909 Unspecified asthma, uncomplicated: Secondary | ICD-10-CM

## 2022-07-01 ENCOUNTER — Other Ambulatory Visit: Payer: Self-pay | Admitting: Physician Assistant

## 2022-07-01 DIAGNOSIS — J45909 Unspecified asthma, uncomplicated: Secondary | ICD-10-CM

## 2022-07-04 NOTE — Telephone Encounter (Signed)
Pt called to report that he also needs the solution, please advise   albuterol (PROVENTIL) (2.5 MG/3ML) 0.083% nebulizer solution

## 2022-07-06 ENCOUNTER — Ambulatory Visit: Payer: Self-pay | Admitting: *Deleted

## 2022-07-06 ENCOUNTER — Other Ambulatory Visit: Payer: Self-pay | Admitting: *Deleted

## 2022-07-06 DIAGNOSIS — J45909 Unspecified asthma, uncomplicated: Secondary | ICD-10-CM

## 2022-07-06 NOTE — Telephone Encounter (Signed)
  Chief Complaint: medication refills dulera, albuterol inhaler, and nebulizer Symptoms: no sx now. Reports being "sick" beginning of March with coughing yellow phlegm, shortness of breath and wheezing. Had to miss work but took more than prescribed medication inhaler and nebulizer. Last dispensed nebulizer 07/01/22 and now out of medication .  Frequency: beginning of March  Pertinent Negatives: Patient denies breathing difficulty now. No c/o cough or sickness Disposition: '[]'$ ED /'[]'$ Urgent ,Care (no appt availability in office) / '[]'$ Appointment(In office/virtual)/ '[]'$  [place] [facility]/ '[]'$ Home Care/ '[]'$ Refused Recommended Disposition /'[]'$ [place] [facility]/ '[x]'$  Follow-up with PCP Additional Notes:   Recommended appt to f/u on recent breathing issues. Patient reports he will call back for appt due to he can not miss any more work due to new job. Please adivse if refills can be given for albuterol(ventolin) inhaler and albuterol(proventil) nebulizer.     Reason for Disposition  [1] MILD longstanding difficulty breathing AND [2]  SAME as normal  Answer Assessment - Initial Assessment Questions 1. RESPIRATORY STATUS: "Describe your breathing?" (e.g., wheezing, shortness of breath, unable to speak, severe coughing)      Normal breathing at this time 2. ONSET: "When did this breathing problem begin?"      Beginning of march 3. PATTERN "Does the difficult breathing come and go, or has it been constant since it started?"      Comes and goes with shortness of breath and wheezing  4. SEVERITY: "How bad is your breathing?" (e.g., mild, moderate, severe)    - MILD: No SOB at rest, mild SOB with walking, speaks normally in sentences, can lie down, no retractions, pulse < 100.    - MODERATE: SOB at rest, SOB with minimal exertion and prefers to sit, cannot lie down flat, speaks in phrases, mild retractions, audible wheezing, pulse 100-120.    - SEVERE: Very SOB at rest, speaks in single words, struggling  to breathe, sitting hunched forward, retractions, pulse > 120      na 5. RECURRENT SYMPTOM: "Have you had difficulty breathing before?" If Yes, ask: "When was the last time?" and "What happened that time?"      Yes and took more than prescribed of inhalers prn and nebulizer 6. CARDIAC HISTORY: "Do you have any history of heart disease?" (e.g., heart attack, angina, bypass surgery, angioplasty)      See hx 7. LUNG HISTORY: "Do you have any history of lung disease?"  (e.g., pulmonary embolus, asthma, emphysema)     See hx  8. CAUSE: "What do you think is causing the breathing problem?"      Got sick and coughing up yellow phlegm and required to use more than prescribed medication 9. OTHER SYMPTOMS: "Do you have any other symptoms? (e.g., dizziness, runny nose, cough, chest pain, fever)     None now . C/o cough productive wheezing shortness of breath with exertion last week 10. O2 SATURATION MONITOR:  "Do you use an oxygen saturation monitor (pulse oximeter) at home?" If Yes, ask: "What is your reading (oxygen level) today?" "What is your usual oxygen saturation reading?" (e.g., 95%)       na 11. PREGNANCY: "Is there any chance you are pregnant?" "When was your last menstrual period?"       na 12. TRAVEL: "Have you traveled out of the country in the last month?" (e.g., travel history, exposures)       na  Protocols used: Breathing Difficulty-A-AH

## 2022-07-07 ENCOUNTER — Other Ambulatory Visit: Payer: Self-pay

## 2022-07-07 ENCOUNTER — Telehealth: Payer: Self-pay

## 2022-07-07 DIAGNOSIS — J45909 Unspecified asthma, uncomplicated: Secondary | ICD-10-CM

## 2022-07-07 MED ORDER — DULERA 100-5 MCG/ACT IN AERO
2.0000 | INHALATION_SPRAY | Freq: Two times a day (BID) | RESPIRATORY_TRACT | 0 refills | Status: DC
  Filled 2022-07-07: qty 13, 30d supply, fill #0

## 2022-07-07 MED ORDER — ALBUTEROL SULFATE (2.5 MG/3ML) 0.083% IN NEBU
2.5000 mg | INHALATION_SOLUTION | Freq: Four times a day (QID) | RESPIRATORY_TRACT | 0 refills | Status: DC | PRN
  Filled 2022-07-07: qty 75, 7d supply, fill #0

## 2022-07-07 MED ORDER — ALBUTEROL SULFATE HFA 108 (90 BASE) MCG/ACT IN AERS
2.0000 | INHALATION_SPRAY | Freq: Four times a day (QID) | RESPIRATORY_TRACT | 0 refills | Status: DC | PRN
  Filled 2022-07-07: qty 6.7, 25d supply, fill #0

## 2022-07-07 NOTE — Telephone Encounter (Signed)
Requested medication (s) are due for refill today: signed 07/01/22  Requested medication (s) are on the active medication list: yes   Last refill:  dulera- 05/04/22 #13g 0 refills, albuterol ventolin inhaler 07/01/22 #9 g 0 refills, proventil nebulizer 03/17/22 #20m 0 refills   Future visit scheduled: no   Notes to clinic:  no refills remain. Requesting early due to patient reports he had to use more than prescribed due to "sickenss". See NT encounter. Do you want to refill Rxs?     Requested Prescriptions  Pending Prescriptions Disp Refills   mometasone-formoterol (DULERA) 100-5 MCG/ACT AERO 13 g 0    Sig: Inhale 2 puffs into the lungs 2 (two) times daily.     Pulmonology:  Combination Products Passed - 07/06/2022  5:52 PM      Passed - Valid encounter within last 12 months    Recent Outpatient Visits           3 months ago Essential hypertension   [place]-C   3 months ago Essential hypertension   [place] PA-C               albuterol (VENTOLIN HFA) 108 (90 Base) MCG/ACT inhaler 9 g 0     Pulmonology:  Beta Agonists 2 Failed - 07/06/2022  5:52 PM      Failed - Last BP in normal range    BP Readings from Last 1 Encounters:  03/22/22 (!) 149/95         Passed - Last Heart Rate in normal range    Pulse Readings from Last 1 Encounters:  03/22/22 70         Passed - Valid encounter within last 12 months    Recent Outpatient Visits           3 months ago Essential hypertension   [place] PA-C   3 months ago Essential hypertension   [place]-C               albuterol (PROVENTIL) (2.5 MG/3ML) 0.083% nebulizer solution 75 mL 0    Sig: Take 3 mLs (2.5 mg total) by nebulization every 6 (six) hours as needed.     Pulmonology:  Beta Agonists 2 Failed - 07/06/2022  5:52 PM      Failed  - Last BP in normal range    BP Readings from Last 1 Encounters:  03/22/22 (!) 149/95         Passed - Last Heart Rate in normal range    Pulse Readings from Last 1 Encounters:  03/22/22 70         Passed - Valid encounter within last 12 months    Recent Outpatient Visits           3 months ago Essential hypertension   [place] PA-C   3 months ago Essential hypertension   [place]

## 2022-07-08 ENCOUNTER — Other Ambulatory Visit: Payer: Self-pay

## 2022-07-11 NOTE — Telephone Encounter (Signed)
Entered in error

## 2022-07-19 ENCOUNTER — Other Ambulatory Visit: Payer: Self-pay

## 2022-07-19 ENCOUNTER — Other Ambulatory Visit: Payer: Self-pay | Admitting: Physician Assistant

## 2022-07-19 DIAGNOSIS — J45909 Unspecified asthma, uncomplicated: Secondary | ICD-10-CM

## 2022-07-21 ENCOUNTER — Other Ambulatory Visit: Payer: Self-pay | Admitting: Physician Assistant

## 2022-07-21 DIAGNOSIS — J45909 Unspecified asthma, uncomplicated: Secondary | ICD-10-CM

## 2022-07-21 MED ORDER — ALBUTEROL SULFATE HFA 108 (90 BASE) MCG/ACT IN AERS: 2.0000 | INHALATION_SPRAY | Freq: Four times a day (QID) | RESPIRATORY_TRACT | 0 refills | Status: DC | PRN

## 2022-07-21 NOTE — Telephone Encounter (Signed)
Medication Refill - Medication: albuterol (VENTOLIN HFA) 108 (90 Base) MCG/ACT inhaler   Has the patient contacted their pharmacy? Yes.     Preferred Pharmacy (with phone number or street name):  [facility] [place] [address] Phone: [phone]  Fax: [phone]     Has the patient been seen for an appointment in the last year OR does the patient have an upcoming appointment? Yes.    Please assist patient further

## 2022-07-21 NOTE — Telephone Encounter (Signed)
Requested medication (s) are due for refill today: no last refill 07/07/22  Requested medication (s) are on the active medication list: yes   Last refill:  07/07/22 #6.7g  0 refills.   Future visit scheduled: yes in 1 month  Notes to clinic: patient requesting refill again until future visit in 1 month. Do you want to refill Rx?     Requested Prescriptions  Pending Prescriptions Disp Refills   albuterol (VENTOLIN HFA) 108 (90 Base) MCG/ACT inhaler 6.7 g 0    Sig: Inhale 2 puffs into the lungs every 6 (six) hours as needed for wheezing or shortness of breath     Pulmonology:  Beta Agonists 2 Failed - 07/21/2022 10:54 AM      Failed - Last BP in normal range    BP Readings from Last 1 Encounters:  03/22/22 (!) 149/95         Passed - Last Heart Rate in normal range    Pulse Readings from Last 1 Encounters:  03/22/22 70         Passed - Valid encounter within last 12 months    Recent Outpatient Visits           4 months ago Essential hypertension   [place], [place], PA-C   4 months ago Essential hypertension   [place], [place], PA-C       Future Appointments             In 1 month [place], [place], PA-C [place], [facility]

## 2022-07-29 ENCOUNTER — Other Ambulatory Visit: Payer: Self-pay

## 2022-08-07 ENCOUNTER — Inpatient Hospital Stay
Admission: EM | Admit: 2022-08-07 | Discharge: 2022-08-10 | DRG: 189 | Disposition: A | Discharge status: Home / Self Care | Payer: 59 | Attending: Family Medicine | Admitting: Family Medicine

## 2022-08-07 ENCOUNTER — Emergency Department: Payer: 59

## 2022-08-07 ENCOUNTER — Other Ambulatory Visit: Payer: Self-pay

## 2022-08-07 DIAGNOSIS — Z8249 Family history of ischemic heart disease and other diseases of the circulatory system: Secondary | ICD-10-CM

## 2022-08-07 DIAGNOSIS — J189 Pneumonia, unspecified organism: Secondary | ICD-10-CM | POA: Diagnosis present

## 2022-08-07 DIAGNOSIS — I10 Essential (primary) hypertension: Principal | ICD-10-CM

## 2022-08-07 DIAGNOSIS — I160 Hypertensive urgency: Secondary | ICD-10-CM | POA: Diagnosis not present

## 2022-08-07 DIAGNOSIS — Z1152 Encounter for screening for COVID-19: Secondary | ICD-10-CM

## 2022-08-07 DIAGNOSIS — J45901 Unspecified asthma with (acute) exacerbation: Secondary | ICD-10-CM | POA: Diagnosis not present

## 2022-08-07 DIAGNOSIS — J9601 Acute respiratory failure with hypoxia: Secondary | ICD-10-CM | POA: Diagnosis not present

## 2022-08-07 DIAGNOSIS — Z7951 Long term (current) use of inhaled steroids: Secondary | ICD-10-CM

## 2022-08-07 DIAGNOSIS — E876 Hypokalemia: Secondary | ICD-10-CM | POA: Diagnosis present

## 2022-08-07 DIAGNOSIS — R0602 Shortness of breath: Secondary | ICD-10-CM | POA: Diagnosis not present

## 2022-08-07 LAB — RESP PANEL BY RT-PCR (RSV, FLU A&B, COVID)  RVPGX2
Influenza A by PCR: NEGATIVE
Influenza B by PCR: NEGATIVE
Resp Syncytial Virus by PCR: NEGATIVE
SARS Coronavirus 2 by RT PCR: NEGATIVE

## 2022-08-07 LAB — BLOOD GAS, VENOUS
Acid-Base Excess: 2.2 mmol/L — ABNORMAL HIGH (ref 0.0–2.0)
Bicarbonate: 29.1 mmol/L — ABNORMAL HIGH (ref 20.0–28.0)
O2 Saturation: 80 %
Patient temperature: 37
pCO2, Ven: 54 mmHg (ref 44–60)
pH, Ven: 7.34 (ref 7.25–7.43)
pO2, Ven: 51 mmHg — ABNORMAL HIGH (ref 32–45)

## 2022-08-07 LAB — BASIC METABOLIC PANEL
Anion gap: 12 (ref 5–15)
BUN: 10 mg/dL (ref 6–20)
CO2: 24 mmol/L (ref 22–32)
Calcium: 9 mg/dL (ref 8.9–10.3)
Chloride: 101 mmol/L (ref 98–111)
Creatinine, Ser: 0.87 mg/dL (ref 0.61–1.24)
GFR, Estimated: >60 mL/min (ref >60)
Glucose, Bld: 117 mg/dL — ABNORMAL HIGH (ref 70–99)
Potassium: 3.3 mmol/L — ABNORMAL LOW (ref 3.5–5.1)
Sodium: 137 mmol/L (ref 135–145)

## 2022-08-07 LAB — CBC WITH DIFFERENTIAL/PLATELET
Abs Immature Granulocytes: 0.02 10*3/uL (ref 0.00–0.07)
Basophils Absolute: 0 10*3/uL (ref 0.0–0.1)
Basophils Relative: 1 %
Eosinophils Absolute: 0.3 10*3/uL (ref 0.0–0.5)
Eosinophils Relative: 4 %
HCT: 53.1 % — ABNORMAL HIGH (ref 39.0–52.0)
Hemoglobin: 17.1 g/dL — ABNORMAL HIGH (ref 13.0–17.0)
Immature Granulocytes: 0 %
Lymphocytes Relative: 8 %
Lymphs Abs: 0.7 10*3/uL (ref 0.7–4.0)
MCH: 29.3 pg (ref 26.0–34.0)
MCHC: 32.2 g/dL (ref 30.0–36.0)
MCV: 91.1 fL (ref 80.0–100.0)
Monocytes Absolute: 0.4 10*3/uL (ref 0.1–1.0)
Monocytes Relative: 5 %
Neutro Abs: 7.2 10*3/uL (ref 1.7–7.7)
Neutrophils Relative %: 82 %
Platelets: 122 10*3/uL — ABNORMAL LOW (ref 150–400)
RBC: 5.83 MIL/uL — ABNORMAL HIGH (ref 4.22–5.81)
RDW: 14.6 % (ref 11.5–15.5)
WBC: 8.7 10*3/uL (ref 4.0–10.5)
nRBC: 0 % (ref 0.0–0.2)

## 2022-08-07 MED ORDER — HYDRALAZINE HCL 10 MG PO TABS
10.0000 mg | ORAL_TABLET | Freq: Once | ORAL | Status: AC
  Administered 2022-08-07: 10 mg via ORAL
  Filled 2022-08-07: qty 1

## 2022-08-07 MED ORDER — IPRATROPIUM-ALBUTEROL 0.5-2.5 (3) MG/3ML IN SOLN
3.0000 mL | RESPIRATORY_TRACT | Status: DC | PRN
  Administered 2022-08-07 – 2022-08-09 (×3): 3 mL via RESPIRATORY_TRACT
  Filled 2022-08-07: qty 12
  Filled 2022-08-07 (×3): qty 3

## 2022-08-07 MED ORDER — HYDRALAZINE HCL 10 MG PO TABS
10.0000 mg | ORAL_TABLET | Freq: Three times a day (TID) | ORAL | Status: DC
  Administered 2022-08-08 – 2022-08-10 (×7): 10 mg via ORAL
  Filled 2022-08-07 (×9): qty 1

## 2022-08-07 MED ORDER — ENOXAPARIN SODIUM 40 MG/0.4ML IJ SOSY
40.0000 mg | PREFILLED_SYRINGE | INTRAMUSCULAR | Status: DC
  Administered 2022-08-08 – 2022-08-10 (×3): 40 mg via SUBCUTANEOUS
  Filled 2022-08-07 (×3): qty 0.4

## 2022-08-07 MED ORDER — MAGNESIUM HYDROXIDE 400 MG/5ML PO SUSP: 30.0000 mL | Freq: Every day | ORAL | Status: DC | PRN

## 2022-08-07 MED ORDER — CARVEDILOL 6.25 MG PO TABS: 12.5000 mg | ORAL_TABLET | Freq: Two times a day (BID) | ORAL | Status: DC

## 2022-08-07 MED ORDER — CARVEDILOL 6.25 MG PO TABS
12.5000 mg | ORAL_TABLET | Freq: Once | ORAL | Status: AC
  Administered 2022-08-07: 12.5 mg via ORAL
  Filled 2022-08-07: qty 2

## 2022-08-07 MED ORDER — IPRATROPIUM-ALBUTEROL 0.5-2.5 (3) MG/3ML IN SOLN
3.0000 mL | Freq: Four times a day (QID) | RESPIRATORY_TRACT | Status: DC
  Administered 2022-08-08 – 2022-08-10 (×9): 3 mL via RESPIRATORY_TRACT
  Filled 2022-08-07 (×9): qty 3

## 2022-08-07 MED ORDER — MAGNESIUM SULFATE 2 GM/50ML IV SOLN
2.0000 g | Freq: Once | INTRAVENOUS | Status: AC
  Administered 2022-08-07: 2 g via INTRAVENOUS
  Filled 2022-08-07: qty 50

## 2022-08-07 MED ORDER — IPRATROPIUM-ALBUTEROL 0.5-2.5 (3) MG/3ML IN SOLN
9.0000 mL | Freq: Once | RESPIRATORY_TRACT | Status: AC
  Administered 2022-08-07: 9 mL via RESPIRATORY_TRACT
  Filled 2022-08-07: qty 9

## 2022-08-07 MED ORDER — ONDANSETRON HCL 4 MG/2ML IJ SOLN: 4.0000 mg | Freq: Four times a day (QID) | INTRAMUSCULAR | Status: DC | PRN

## 2022-08-07 MED ORDER — PREDNISONE 20 MG PO TABS
60.0000 mg | ORAL_TABLET | Freq: Once | ORAL | Status: AC
  Administered 2022-08-07: 60 mg via ORAL
  Filled 2022-08-07: qty 3

## 2022-08-07 MED ORDER — GUAIFENESIN ER 600 MG PO TB12
600.0000 mg | ORAL_TABLET | Freq: Two times a day (BID) | ORAL | Status: DC
  Administered 2022-08-08 – 2022-08-10 (×6): 600 mg via ORAL
  Filled 2022-08-07 (×6): qty 1

## 2022-08-07 MED ORDER — SODIUM CHLORIDE 0.9 % IV SOLN: INTRAVENOUS | Status: DC

## 2022-08-07 MED ORDER — TRAZODONE HCL 50 MG PO TABS
25.0000 mg | ORAL_TABLET | Freq: Every evening | ORAL | Status: DC | PRN
  Administered 2022-08-08: 25 mg via ORAL
  Filled 2022-08-07: qty 1

## 2022-08-07 MED ORDER — ONDANSETRON HCL 4 MG PO TABS: 4.0000 mg | ORAL_TABLET | Freq: Four times a day (QID) | ORAL | Status: DC | PRN

## 2022-08-07 MED ORDER — HYDRALAZINE HCL 10 MG PO TABS: 10.0000 mg | ORAL_TABLET | Freq: Three times a day (TID) | ORAL | 0 refills | Status: DC

## 2022-08-07 MED ORDER — HYDRALAZINE HCL 10 MG PO TABS
10.0000 mg | ORAL_TABLET | Freq: Three times a day (TID) | ORAL | Status: DC
  Filled 2022-08-07 (×2): qty 1

## 2022-08-07 MED ORDER — ACETAMINOPHEN 650 MG RE SUPP: 650.0000 mg | Freq: Four times a day (QID) | RECTAL | Status: DC | PRN

## 2022-08-07 MED ORDER — PREDNISONE 20 MG PO TABS
40.0000 mg | ORAL_TABLET | Freq: Every day | ORAL | Status: DC
  Administered 2022-08-09 – 2022-08-10 (×2): 40 mg via ORAL
  Filled 2022-08-07 (×2): qty 2

## 2022-08-07 MED ORDER — METHYLPREDNISOLONE SODIUM SUCC 40 MG IJ SOLR
40.0000 mg | Freq: Two times a day (BID) | INTRAMUSCULAR | Status: AC
  Administered 2022-08-08 (×2): 40 mg via INTRAVENOUS
  Filled 2022-08-07 (×2): qty 1

## 2022-08-07 MED ORDER — ACETAMINOPHEN 325 MG PO TABS: 650.0000 mg | ORAL_TABLET | Freq: Four times a day (QID) | ORAL | Status: DC | PRN

## 2022-08-07 MED ORDER — CARVEDILOL 6.25 MG PO TABS
12.5000 mg | ORAL_TABLET | Freq: Two times a day (BID) | ORAL | Status: DC
  Administered 2022-08-08: 12.5 mg via ORAL
  Filled 2022-08-07: qty 2

## 2022-08-07 MED ORDER — HYDROCOD POLI-CHLORPHE POLI ER 10-8 MG/5ML PO SUER: 5.0000 mL | Freq: Two times a day (BID) | ORAL | Status: DC | PRN

## 2022-08-07 MED ORDER — ALBUTEROL SULFATE (2.5 MG/3ML) 0.083% IN NEBU: 3.0000 mL | INHALATION_SOLUTION | RESPIRATORY_TRACT | Status: DC | PRN

## 2022-08-07 MED ORDER — CARVEDILOL 12.5 MG PO TABS: 12.5000 mg | ORAL_TABLET | Freq: Two times a day (BID) | ORAL | 0 refills | Status: DC

## 2022-08-07 NOTE — Discharge Instructions (Signed)
I refilled your hypertension medications, pick them up from the pharmacy and take as directed.  Follow-up with your doctor to recheck your blood pressure.  Take your albuterol inhaler or nebulizer treatments every 2 hours for the next day, and then every 4 hours the day after that, and then as needed for shortness of breath or wheezing moving forward.  Take prednisone for the full course as prescribed.  Thank you for choosing [place] for your health care today!  Please see your primary doctor this week for a follow up appointment.   Sometimes, in the early stages of certain disease courses it is difficult to detect in the emergency department evaluation -- so, it is important that you continue to monitor your symptoms and call your doctor right away or return to the emergency department if you develop any new or worsening symptoms.  Please go to the following website to schedule new (and existing) patient appointments:   [email or web address]  If you do not have a primary doctor try calling the following clinics to establish care:  If you have insurance:  [facility] [phone] [address] [place] [place] [address]   [facility]  [phone] [address] [place] [place] [address]   If you do not have insurance:  [facility]  [phone] [address], [place] [address]   The following is another list of primary care offices in the area who are accepting new patients at this time.  Please reach out to one of them directly and let them know you would like to schedule an appointment to follow up on an Emergency Department visit, and/or to establish a new primary care provider (PCP).  There are likely other primary care clinics in the are who are accepting new patients, but this is an excellent place to start:  [facility] Lead physician: Dr Shirlee Latch [address] [place], [place]  [address] [phone]  [facility] Lead Physician: Dr Alba Cory [address], [place], [place] [address] [phone]  [facility]  Lead Physician: Dr Olevia Perches [address] [place], [place] [address] [phone]  [facility] Lead Physician: Dr Sofie Hartigan [address] [place], [place], [place] [address] [phone]  [facility] at [facility] Lead Physician: Dr Bari Edward [address] [place], [place] [address] [phone]   It was my pleasure to care for you today.   Daneil Dan Modesto Charon, MD

## 2022-08-07 NOTE — ED Triage Notes (Signed)
BIB medic from home.  Pt rpting SOB which started a few hours ago.  Pt rpts taking 8-100neb txs at home.  Pt has a hx of asthma and HTN.  Medic rpts pt was diaphoretic ans tachycardic enroute to ED

## 2022-08-07 NOTE — ED Provider Notes (Signed)
[facility] Provider Note    Event Date/Time   First MD Initiated Contact with Patient 08/07/22 2019     (approximate)   History   Shortness of Breath (BIB medic.  Onset "a few hours ago."  Pt rpting SOB with no relief from at home nebulizer txs.  Pt rpts completing 8-10 neb txs at home.  Pt has a hx of HTN and asthma.  Medic rpts pts diaphoretic and tachycardic in route.  Pt placed on 10L non-rebreather enroute to ED)   HPI  Ruben Gonzalez is a 52 y.o. male   Past medical history of asthma and hypertension who presents to the emergency department asthma exacerbation, inciting event is usually change in weather or pollen.  Worsening over 3 days.  Refractory to his nebs at home.  Got nebs by EMS and feels slightly better.  Put on nonrebreather for hypoxemia but is normalized now.  Diffuse wheezing.  Does not have chest pain, no recent fevers or chills.  Has had a cough, chest tightness and clear sputum.  Has been without his hypertensive medications due to lack of refill for the last 1 week.  ---  Feels slightly better but then desaturated to 81% on room air and put on 4 L nasal cannula with improvement to 97%.  Continues to have wheezing throughout, plan for admission. --   External Medical Documents Reviewed: Discharge summary from hospitalization in October 2023 when he was hospitalized for acute exacerbation of asthma      Physical Exam   Triage Vital Signs: ED Triage Vitals  Enc Vitals Group     BP 08/07/22 2011 (!) 188/123     Pulse Rate 08/07/22 2011 (!) 121     Resp 08/07/22 2011 12     Temp 08/07/22 2011 98.4 F (36.9 C)     Temp src --      SpO2 08/07/22 2011 98 %     Weight 08/07/22 2006 170 lb (77.1 kg)     Height 08/07/22 2006 5\' 9"  (1.753 m)     Head Circumference --      Peak Flow --      Pain Score 08/07/22 2006 0     Pain Loc --      Pain Edu? --      Excl. in GC? --     Most recent vital signs: Vitals:   08/07/22  2200 08/07/22 2210  BP: (!) 165/102   Pulse: (!) 117 (!) 119  Resp: (!) 31 18  Temp:    SpO2: 98% 96%    General: Awake, no distress.  CV:  Good peripheral perfusion.  Resp:  Increased work of breathing with diffuse wheezing throughout Abd:  No distention.    ED Results / Procedures / Treatments   Labs (all labs ordered are listed, but only abnormal results are displayed) Labs Reviewed  BASIC METABOLIC PANEL - Abnormal; Notable for the following components:      Result Value   Potassium 3.3 (*)    Glucose, Bld 117 (*)    All other components within normal limits  CBC WITH DIFFERENTIAL/PLATELET - Abnormal; Notable for the following components:   RBC 5.83 (*)    Hemoglobin 17.1 (*)    HCT 53.1 (*)    Platelets 122 (*)    All other components within normal limits  BLOOD GAS, VENOUS - Abnormal; Notable for the following components:   pO2, Ven 51 (*)    Bicarbonate 29.1 (*)  Acid-Base Excess 2.2 (*)    All other components within normal limits  RESP PANEL BY RT-PCR (RSV, FLU A&B, COVID)  [id number]     I ordered and reviewed the above labs they are notable for blood gas shows normal pH and CO2  EKG  ED ECG REPORT I, Pilar Jarvis, the attending physician, personally viewed and interpreted this ECG.   Date: 08/07/2022  EKG Time: 2029  Rate: 119  Rhythm: sinus tachycardia  Axis: nl  Intervals:none  ST&T Change: No acute ischemic changes    RADIOLOGY I independently reviewed and interpreted chest x-ray and see no obvious focality or pneumothorax   PROCEDURES:  Critical Care performed: Yes, see critical care procedure note(s)  .Critical Care  Performed by: Pilar Jarvis, MD Authorized by: Pilar Jarvis, MD   Critical care provider statement:    Critical care time (minutes):  30   Critical care was necessary to treat or prevent imminent or life-threatening deterioration of the following conditions:  Respiratory failure   Critical care was time spent personally by  me on the following activities:  Development of treatment plan with patient or surrogate, discussions with consultants, evaluation of patient's response to treatment, examination of patient, ordering and review of laboratory studies, ordering and review of radiographic studies, ordering and performing treatments and interventions, pulse oximetry, re-evaluation of patient's condition and review of old charts    MEDICATIONS ORDERED IN ED: Medications  magnesium sulfate IVPB 2 g 50 mL (0 g Intravenous Stopped 08/07/22 2215)  ipratropium-albuterol (DUONEB) 0.5-2.5 (3) MG/3ML nebulizer solution 9 mL (9 mLs Nebulization Given 08/07/22 2115)  predniSONE (DELTASONE) tablet 60 mg (60 mg Oral Given 08/07/22 2115)  hydrALAZINE (APRESOLINE) tablet 10 mg (10 mg Oral Given 08/07/22 2135)  carvedilol (COREG) tablet 12.5 mg (12.5 mg Oral Given 08/07/22 2135)    External physician / consultants:  I spoke with hospitalist for admission and regarding care plan for this patient.   IMPRESSION / MDM / ASSESSMENT AND PLAN / ED COURSE  I reviewed the triage vital signs and the nursing notes.                                Patient's presentation is most consistent with acute presentation with potential threat to life or bodily function.  Differential diagnosis includes, but is not limited to, acute exacerbation of asthma, pneumothorax, respiratory infection, bacterial pneumonia, sepsis   The patient is on the cardiac monitor to evaluate for evidence of arrhythmia and/or significant heart rate changes.  MDM: Patient with a history of asthma with prior hospitalization with acute asthma exacerbation that is evident with increased work of breathing and diffuse wheezing throughout.  He had some improvement with our DuoNebs and steroids as well as magnesium but when I rechecked him he continues to be symptomatic.  During his hospital stay he was at first saturating well but then became hypoxemic to 81% on room air and put on  nasal cannula with improvement.  Plan for admission for asthma exacerbation with new O2 requirement         FINAL CLINICAL IMPRESSION(S) / ED DIAGNOSES   Final diagnoses:  Uncontrolled hypertension  Acute severe exacerbation of asthma     Rx / DC Orders   ED Discharge Orders          Ordered    carvedilol (COREG) 12.5 MG tablet  2 times daily  08/07/22 2041    hydrALAZINE (APRESOLINE) 10 MG tablet  Every 8 hours        08/07/22 2041             Note:  This document was prepared using Dragon voice recognition software and may include unintentional dictation errors.    Pilar JarvisWong, Moneisha Vosler, MD 08/07/22 2330

## 2022-08-07 NOTE — H&P (Incomplete)
[name]   PATIENT NAME: Ruben Gonzalez    MR#:  [id number]  DATE OF BIRTH:  21-Ruben Gonzalez-1972  DATE OF ADMISSION:  08/07/2022  PRIMARY CARE PHYSICIAN: [name], PA-C   Patient is coming from: Home  REQUESTING/REFERRING PHYSICIAN: Pilar Jarvis, MD  CHIEF COMPLAINT:   Chief Complaint  Patient presents with   Shortness of Breath    BIB medic.  Onset "a few hours ago."  Pt rpting SOB with no relief from at home nebulizer txs.  Pt rpts completing 8-10 neb txs at home.  Pt has a hx of HTN and asthma.  Medic rpts pts diaphoretic and tachycardic in route.  Pt placed on 10L non-rebreather enroute to ED    HISTORY OF PRESENT ILLNESS:  Ruben Gonzalez is a 52 y.o. African-American male with medical history significant for asthma and hypertension, presented to the emergency room with acute onset of worsening dyspnea with associated cough and wheezing at home with no relief after taking 8-10 nebulized bronchodilators treatments at home.  He was diaphoretic and tachycardic on arrival to the hospital.  He was placed on 10 L nonrebreather.  He denies any chest pain or palpitations.  No nausea or vomiting or abdominal pain.  No dysuria, oliguria or hematuria or flank pain.  He was still in respiratory distress in the ER that he required continuous nebulized albuterol.  Pulse oximetry is dropped to 81% on room air on 4 L and of O2 by nasal cannula to improve to 97%.  ED Course: When he came to the ER, BP was 163/132 with heart rate of 120 and respiratory to 33 and pulse oximetry as above.  Labs revealed VBG with pH 7.34 and bicarbonate 29.1, PE CO2 of 54 and pO2 51 and O2 sat of 80%.  BMP showed mild hypokalemia 3.3 and CBC showed hemoconcentration. EKG as reviewed by me : EKG showed sinus tachycardia with a rate of 119 with possible left atrial enlargement Imaging: Two-view chest x-ray showed findings that may represent mild viral bronchitis versus reactive airway disease and subcentimeter  bibasilar lung nodules could not be excluded. Chest CT without contrast revealed the following: 1. Scattered small bronchial impactions in both upper and both lower lobes, in keeping with small airways disease likely of other-than-aspiration etiology, with background central bronchitis. 2. Focal ground-glass infiltrate in the posterior base of the right upper lobe probably due to a small pneumonia. A follow-up study is recommended to ensure clearing. 3. Early COPD changes lung apices. No nodules or subpleural fibrosis. 4. Single vessel calcific CAD, LAD coronary artery and minimal anterior pericardial fluid. 5. Trace aortic atherosclerosis.  No aneurysm.   The patient was given DuoNeb, 2 g of IV magnesium sulfate, 10 mg of p.o. hydralazine and 12.5 mg of p.o. Coreg and swelling as 60 mg of p.o. prednisone.  He was ordered continuous nebulizer albuterol and will be admitted to a progressive unit bed for further evaluation and management. PAST MEDICAL HISTORY:   Past Medical History:  Diagnosis Date   Allergies    Asthma    Hypertension     PAST SURGICAL HISTORY:   Past Surgical History:  Procedure Laterality Date   VASECTOMY      SOCIAL HISTORY:   Social History   Tobacco Use   Smoking status: Never   Smokeless tobacco: Never  Substance Use Topics   Alcohol use: Yes    Alcohol/week: 14.0 standard drinks of alcohol    Types: 6 Cans of  beer, 8 Shots of liquor per week    FAMILY HISTORY:   Family History  Problem Relation Age of Onset   Heart attack Mother    Allergies Mother     DRUG ALLERGIES:  No Known Allergies  REVIEW OF SYSTEMS:   ROS As per history of present illness. All pertinent systems were reviewed above. Constitutional, HEENT, cardiovascular, respiratory, GI, GU, musculoskeletal, neuro, psychiatric, endocrine, integumentary and hematologic systems were reviewed and are otherwise negative/unremarkable except for positive findings mentioned above in  the HPI.   MEDICATIONS AT HOME:   Prior to Admission medications   Medication Sig Start Date End Date Taking? Authorizing Provider  albuterol (PROVENTIL) (2.5 MG/3ML) 0.083% nebulizer solution Take 3 mLs (2.5 mg total) by nebulization every 6 (six) hours as needed. 07/07/22  Yes Ostwalt, Edmon Crape, PA-C  albuterol (VENTOLIN HFA) 108 (90 Base) MCG/ACT inhaler Inhale 2 puffs into the lungs every 6 (six) hours as needed for wheezing or shortness of breath 07/21/22  Yes Malva Limes, MD  carvedilol (COREG) 12.5 MG tablet Take 1 tablet (12.5 mg total) by mouth 2 (two) times daily. 08/07/22 11/05/22 Yes Pilar Jarvis, MD  hydrALAZINE (APRESOLINE) 10 MG tablet Take 1 tablet (10 mg total) by mouth every 8 (eight) hours. 08/07/22 11/05/22 Yes Pilar Jarvis, MD  mometasone-formoterol [facility]) 100-5 MCG/ACT AERO Inhale 2 puffs into the lungs 2 (two) times daily. Patient not taking: Reported on 08/07/2022 07/07/22   [name], PA-C      VITAL SIGNS:  Blood pressure (!) 159/99, pulse (!) 108, temperature 98.4 F (36.9 C), resp. rate 18, height 5\' 9"  (1.753 m), weight 77.1 kg, SpO2 100 %.  PHYSICAL EXAMINATION:  Physical Exam  GENERAL: Acutely ill, diaphoretic 52 y.o.-year-old African-American male patient sitting in bed in moderate respiratory distress. EYES: Pupils equal, round, reactive to light and accommodation. No scleral icterus. Extraocular muscles intact.  HEENT: Head atraumatic, normocephalic. Oropharynx and nasopharynx clear.  NECK:  Supple, no jugular venous distention. No thyroid enlargement, no tenderness.  LUNGS: Diffuse extra wheezes with tight expiratory airflow and harsh vesicular breathing with use of accessory muscles of respiration.  CARDIOVASCULAR: Regular rate and rhythm, S1, S2 normal. No murmurs, rubs, or gallops.  ABDOMEN: Soft, nondistended, nontender. Bowel sounds present. No organomegaly or mass.  EXTREMITIES: No pedal edema, cyanosis, or clubbing.  NEUROLOGIC: Cranial nerves II  through XII are intact. Muscle strength 5/5 in all extremities. Sensation intact. Gait not checked.  PSYCHIATRIC: The patient is alert and oriented x 3.  Normal affect and good eye contact. SKIN: No obvious rash, lesion, or ulcer.   LABORATORY PANEL:   CBC Recent Labs  Lab 08/07/22 2114  WBC 8.7  HGB 17.1*  HCT 53.1*  PLT 122*   ------------------------------------------------------------------------------------------------------------------  Chemistries  Recent Labs  Lab 08/07/22 2114  NA 137  K 3.3*  CL 101  CO2 24  GLUCOSE 117*  BUN 10  CREATININE 0.87  CALCIUM 9.0   ------------------------------------------------------------------------------------------------------------------  Cardiac Enzymes No results for input(s): "TROPONINI" in the last 168 hours. ------------------------------------------------------------------------------------------------------------------  RADIOLOGY:  DG Chest 2 View  Result Date: 08/07/2022 CLINICAL DATA:  Shortness of breath. EXAM: CHEST - 2 VIEW COMPARISON:  February 10, 2022 FINDINGS: The heart size and mediastinal contours are within normal limits. Mildly increased suprahilar and infrahilar lung markings are noted, bilaterally. In ill-defined 7 mm nodular opacity is seen overlying the right lung base. This projects over the eighth right rib and represents a new finding. Adjacent, similar appearing 3 mm  and 4 mm nodular opacities are seen overlying the left lung base. There is no evidence of focal consolidation, pleural effusion or pneumothorax. The visualized skeletal structures are unremarkable. IMPRESSION: 1. Findings which may represent mild viral bronchitis versus reactive airway disease. 2. Subcentimeter bibasilar lung nodules cannot be excluded. Correlation with nonemergent chest CT is recommended. Electronically Signed   By: Aram Candela M.D.   On: 08/07/2022 21:03      IMPRESSION AND PLAN:  Assessment and Plan: * Acute  respiratory failure with hypoxia - The patient will be admitted to a progressive unit bed. - He is currently on 100% nonrebreather and will be placed on continuous nebulized albuterol. - With lack of improvement in his respiratory status he may need BiPAP as a next step. - VBG will be obtained. - We will continue steroids with IV Solu-Medrol. - DuoNebs will be provided 4 times daily and every 4 hours as needed. - We will add mucolytic therapy.  Acute severe exacerbation of asthma - Management as above. - We will hold off Dulera for now given his acute asthma exacerbation specially to avoid long-acting beta agonist and as he will be on IV steroids.  CAP (community acquired pneumonia) - This was suspected on chest CTA and could be the culprit for his COPD exacerbation. - The patient has subsequent sepsis is manifested by tachycardia and tachypnea. - We will place him on IV Rocephin and Zithromax. - We will follow-up cultures. - Mucolytic therapy will be provided.  Hypertensive urgency - We will continue his antihypertensives. - We will place him on as needed IV labetalol and hydralazine.   DVT prophylaxis: Lovenox.  Advanced Care Planning:  Code Status: full code.  Family Communication:  The plan of care was discussed in details with the patient (and family). I answered all questions. The patient agreed to proceed with the above mentioned plan. Further management will depend upon hospital course. Disposition Plan: Back to previous home environment Consults called: none.  All the records are reviewed and case discussed with ED provider.  Status is: Observation  I certify that at the time of admission, it is my clinical judgment that the patient will require  hospital care extending less than 2 midnights.                            Dispo: The patient is from: Home              Anticipated d/c is to: Home              Patient currently is not medically stable to d/c.               Difficult to place patient: No Authorized and performed by: Valente David, MD Total critical care time: Approximately   50    minutes. Due to a high probability of clinically significant, life-threatening deterioration, the patient required my highest level of preparedness to intervene emergently and I personally spent this critical care time directly and personally managing the patient.  This critical care time included obtaining a history, examining the patient, pulse oximetry, ordering and review of studies, arranging urgent treatment with development of management plan, evaluation of patient's response to treatment, frequent reassessment, and discussions with other providers. This critical care time was performed to assess and manage the high probability of imminent, life-threatening deterioration that could result in multiorgan failure.  It was exclusive of separately billable procedures and  treating other patients and teaching time.   Ruben BeatJan A Nigil Gonzalez M.D on 08/08/2022 at 1:21 AM  [facility]   From 7 PM-7 AM, contact night-coverage [email or web address]  CC: Primary care physician; Debera Latstwalt, Janna, PA-C

## 2022-08-08 DIAGNOSIS — Z1152 Encounter for screening for COVID-19: Secondary | ICD-10-CM | POA: Diagnosis not present

## 2022-08-08 DIAGNOSIS — E876 Hypokalemia: Secondary | ICD-10-CM | POA: Diagnosis present

## 2022-08-08 DIAGNOSIS — Z8249 Family history of ischemic heart disease and other diseases of the circulatory system: Secondary | ICD-10-CM | POA: Diagnosis not present

## 2022-08-08 DIAGNOSIS — J189 Pneumonia, unspecified organism: Secondary | ICD-10-CM

## 2022-08-08 DIAGNOSIS — J9601 Acute respiratory failure with hypoxia: Secondary | ICD-10-CM | POA: Diagnosis present

## 2022-08-08 DIAGNOSIS — Z7951 Long term (current) use of inhaled steroids: Secondary | ICD-10-CM | POA: Diagnosis not present

## 2022-08-08 DIAGNOSIS — R0602 Shortness of breath: Secondary | ICD-10-CM | POA: Diagnosis present

## 2022-08-08 DIAGNOSIS — I160 Hypertensive urgency: Secondary | ICD-10-CM

## 2022-08-08 DIAGNOSIS — I10 Essential (primary) hypertension: Secondary | ICD-10-CM | POA: Diagnosis present

## 2022-08-08 DIAGNOSIS — J45901 Unspecified asthma with (acute) exacerbation: Secondary | ICD-10-CM | POA: Diagnosis present

## 2022-08-08 LAB — CBC
HCT: 53.8 % — ABNORMAL HIGH (ref 39.0–52.0)
Hemoglobin: 17.1 g/dL — ABNORMAL HIGH (ref 13.0–17.0)
MCH: 28.6 pg (ref 26.0–34.0)
MCHC: 31.8 g/dL (ref 30.0–36.0)
MCV: 90.1 fL (ref 80.0–100.0)
Platelets: 153 10*3/uL (ref 150–400)
RBC: 5.97 MIL/uL — ABNORMAL HIGH (ref 4.22–5.81)
RDW: 14.5 % (ref 11.5–15.5)
WBC: 5.7 10*3/uL (ref 4.0–10.5)
nRBC: 0 % (ref 0.0–0.2)

## 2022-08-08 LAB — BASIC METABOLIC PANEL
Anion gap: 8 (ref 5–15)
BUN: 9 mg/dL (ref 6–20)
CO2: 22 mmol/L (ref 22–32)
Calcium: 8 mg/dL — ABNORMAL LOW (ref 8.9–10.3)
Chloride: 105 mmol/L (ref 98–111)
Creatinine, Ser: 0.74 mg/dL (ref 0.61–1.24)
GFR, Estimated: >60 mL/min (ref >60)
Glucose, Bld: 115 mg/dL — ABNORMAL HIGH (ref 70–99)
Potassium: 5 mmol/L (ref 3.5–5.1)
Sodium: 135 mmol/L (ref 135–145)

## 2022-08-08 LAB — PROTIME-INR
INR: 1 (ref 0.8–1.2)
Prothrombin Time: 13.5 seconds (ref 11.4–15.2)

## 2022-08-08 LAB — APTT: aPTT: 31 seconds (ref 24–36)

## 2022-08-08 LAB — LACTIC ACID, PLASMA
Lactic Acid, Venous: 1.7 mmol/L (ref 0.5–1.9)
Lactic Acid, Venous: 1.7 mmol/L (ref 0.5–1.9)

## 2022-08-08 MED ORDER — SODIUM CHLORIDE 0.9 % IV SOLN
500.0000 mg | INTRAVENOUS | Status: DC
  Administered 2022-08-08 – 2022-08-10 (×3): 500 mg via INTRAVENOUS
  Filled 2022-08-08: qty 5
  Filled 2022-08-08: qty 500
  Filled 2022-08-08: qty 5

## 2022-08-08 MED ORDER — SODIUM CHLORIDE 0.9 % IV SOLN
2.0000 g | INTRAVENOUS | Status: DC
  Administered 2022-08-08 – 2022-08-10 (×3): 2 g via INTRAVENOUS
  Filled 2022-08-08: qty 20
  Filled 2022-08-08: qty 2
  Filled 2022-08-08: qty 20

## 2022-08-08 MED ORDER — MAGNESIUM SULFATE 2 GM/50ML IV SOLN
2.0000 g | Freq: Once | INTRAVENOUS | Status: AC
  Administered 2022-08-08: 2 g via INTRAVENOUS
  Filled 2022-08-08: qty 50

## 2022-08-08 MED ORDER — ALBUTEROL SULFATE (2.5 MG/3ML) 0.083% IN NEBU
2.5000 mg | INHALATION_SOLUTION | Freq: Once | RESPIRATORY_TRACT | Status: AC
  Administered 2022-08-08: 2.5 mg via RESPIRATORY_TRACT
  Filled 2022-08-08: qty 3

## 2022-08-08 MED ORDER — IPRATROPIUM-ALBUTEROL 0.5-2.5 (3) MG/3ML IN SOLN
12.0000 mL | RESPIRATORY_TRACT | Status: DC
  Administered 2022-08-08: 12 mL via RESPIRATORY_TRACT

## 2022-08-08 MED ORDER — LACTATED RINGERS IV SOLN
150.0000 mL/h | INTRAVENOUS | Status: DC
  Administered 2022-08-08: 150 mL/h via INTRAVENOUS

## 2022-08-08 MED ORDER — CARVEDILOL 25 MG PO TABS
25.0000 mg | ORAL_TABLET | Freq: Two times a day (BID) | ORAL | Status: DC
  Administered 2022-08-08 – 2022-08-10 (×4): 25 mg via ORAL
  Filled 2022-08-08 (×4): qty 1

## 2022-08-08 NOTE — ED Notes (Signed)
Patient sitting up in bed eating breakfast tray. NAD noted.

## 2022-08-08 NOTE — Assessment & Plan Note (Signed)
-   We will continue his antihypertensives. - We will place him on as needed IV labetalol and hydralazine.

## 2022-08-08 NOTE — Hospital Course (Addendum)
Ruben Gonzalez is a 52 y.o. African-American male with medical history significant for asthma and hypertension, presented to the emergency room with acute onset of worsening dyspnea with associated cough and wheezing at home with no relief after taking 8-10 nebulized bronchodilators treatments at home.  04/07: He was diaphoretic and tachycardic on arrival to the hospital.  He was placed on 10 L NRB.  Required continuous nebulized albuterol.  (+)SIRS. CXR mild viral bronchitis versus reactive airway disease. CT chest: Scattered small bronchial impactions in both upper and both lower lobes, in keeping with small airways disease likely of other-than-aspiration etiology, with background central bronchitis,  focal ground-glass infiltrate in the posterior base of the right upper lobe probably due to a small pneumonia. Given DuoNeb, 2 g of IV magnesium sulfate, 10 mg of p.o. hydralazine and 12.5 mg of p.o. Coreg, 60 mg of p.o. prednisone. He was ordered continuous nebulizer albuterol and will be admitted 04/08: still hypoxic but down to 3L [place] O2, (+)substantial wheezing on breath sounds. Continue nebs and close inpatient monitoring.  04/09: still w/ significant wheezing and O2 requirement.    Consultants:  none  Procedures: none      ASSESSMENT & PLAN:   Principal Problem:   Acute respiratory failure with hypoxia Active Problems:   Acute severe exacerbation of asthma   CAP (community acquired pneumonia)   Hypertensive urgency   Acute respiratory failure with hypoxia due to asthma exacerbation and CAP SIRS d/t asthma exacerbation vs Sepsis d/t CAP 100% nonrebreather and continuous nebulized albuterol --> 4L [place] and SpO2 98% IV Solu-Medrol to transition to po prednisone  DuoNebs 4 times daily and every 4 hours as needed. mucolytic therapy. Continuous pulse ox  hold off Dulera for now given his acute asthma exacerbation specially to avoid long-acting beta agonist and as he will be on IV  steroids. IV Rocephin and Zithromax follow-up cultures. May need to go home on O2  Hypertensive urgency continue his antihypertensives. Increase Coreg to 25 mg bid  as needed IV labetalol and hydralazine.    DVT prophylaxis: lovenox  Pertinent IV fluids/nutrition: no continuous IV fluids Central lines / invasive devices: none  Code Status: FULL CODE   Current Admission Status: observation (may need inpatient given respiratory failure? Will d/w utilization RN)   TOC needs / Dispo plan: anticipate d/c home to previous environment, may need home O2 Barriers to discharge / significant pending items: improvement in oxygenation

## 2022-08-08 NOTE — Assessment & Plan Note (Addendum)
-   This was suspected on chest CTA and could be the culprit for his COPD exacerbation. - The patient has subsequent sepsis is manifested by tachycardia and tachypnea. - We will place him on IV Rocephin and Zithromax. - We will follow-up cultures. - Mucolytic therapy will be provided.

## 2022-08-08 NOTE — ED Notes (Signed)
RN called lab to assist with morning blood draws.  RN has attempted to obtain blood to no avail.  Lines do not draw back

## 2022-08-08 NOTE — Progress Notes (Signed)
PROGRESS NOTE    Ruben Gonzalez   AFB:[id number] DOB: Jul 21, 1970  DOA: 08/07/2022 Date of Service: 08/08/22 PCP: [name], PA-C     Brief Narrative / Hospital Course:  Ruben Gonzalez is a 52 y.o. African-American male with medical history significant for asthma and hypertension, presented to the emergency room with acute onset of worsening dyspnea with associated cough and wheezing at home with no relief after taking 8-10 nebulized bronchodilators treatments at home.  04/07: He was diaphoretic and tachycardic on arrival to the hospital.  He was placed on 10 L NRB.  Required continuous nebulized albuterol.  (+)SIRS. CXR mild viral bronchitis versus reactive airway disease. CT chest: Scattered small bronchial impactions in both upper and both lower lobes, in keeping with small airways disease likely of other-than-aspiration etiology, with background central bronchitis,  focal ground-glass infiltrate in the posterior base of the right upper lobe probably due to a small pneumonia. Given DuoNeb, 2 g of IV magnesium sulfate, 10 mg of p.o. hydralazine and 12.5 mg of p.o. Coreg, 60 mg of p.o. prednisone. He was ordered continuous nebulizer albuterol and will be admitted 04/08: still hypoxic but down to 3L [place] O2, (+)substantial wheezing on breath sounds. Continue nebs and close inpatient monitoring.    Consultants:  none  Procedures: none      ASSESSMENT & PLAN:   Principal Problem:   Acute respiratory failure with hypoxia Active Problems:   Acute severe exacerbation of asthma   CAP (community acquired pneumonia)   Hypertensive urgency   Acute respiratory failure with hypoxia due to asthma exacerbation and CAP SIRS d/t asthma exacerbation vs Sepsis d/t CAP 100% nonrebreather and continuous nebulized albuterol --> 4L [place] and SpO2 98% IV Solu-Medrol to transition to po prednisone  DuoNebs 4 times daily and every 4 hours as needed. mucolytic therapy. Continuous pulse ox   hold off Dulera for now given his acute asthma exacerbation specially to avoid long-acting beta agonist and as he will be on IV steroids. IV Rocephin and Zithromax follow-up cultures.  Hypertensive urgency continue his antihypertensives. Increase Coreg to 25 mg bid  as needed IV labetalol and hydralazine.    DVT prophylaxis: lovenox  Pertinent IV fluids/nutrition: no continuous IV fluids Central lines / invasive devices: none  Code Status: FULL CODE   Current Admission Status: observation (may need inpatient given respiratory failure? Will d/w utilization RN)   TOC needs / Dispo plan: anticipate d/c home to previous environment, may need home O2 Barriers to discharge / significant pending items: improvement in oxygenation              Subjective / Brief ROS:  Patient reports SOB Denies CP.  Pain controlled.  Denies new weakness.  Tolerating diet.  Reports no concerns w/ urination/defecation.   Family Communication: none at this time     Objective Findings:  Vitals:   08/08/22 1330 08/08/22 1400 08/08/22 1430 08/08/22 1500  BP: (!) 134/97 (!) 142/106 (!) 147/115 (!) 166/124  Pulse: 98 90 99 (!) 104  Resp: 13 13 12 15   Temp:      TempSrc:      SpO2: 94% 94% 95% 96%  Weight:      Height:        Intake/Output Summary (Last 24 hours) at 08/08/2022 1627 Last data filed at 08/07/2022 2215 Gross per 24 hour  Intake 43.36 ml  Output --  Net 43.36 ml   Filed Weights   08/07/22 2006  Weight: 77.1 kg  Examination:  Physical Exam Constitutional:      General: He is not in acute distress.    Appearance: Normal appearance.  Cardiovascular:     Rate and Rhythm: Normal rate and regular rhythm.     Heart sounds: Normal heart sounds.  Pulmonary:     Effort: No tachypnea, accessory muscle usage or respiratory distress.     Breath sounds: Examination of the right-upper field reveals wheezing and rhonchi. Examination of the left-upper field reveals wheezing  and rhonchi. Examination of the right-middle field reveals wheezing and rhonchi. Examination of the left-middle field reveals wheezing and rhonchi. Examination of the right-lower field reveals decreased breath sounds. Examination of the left-lower field reveals decreased breath sounds. Decreased breath sounds, wheezing and rhonchi present.  Abdominal:     General: Abdomen is flat. Bowel sounds are normal.     Palpations: Abdomen is soft.  Musculoskeletal:     Right lower leg: No edema.     Left lower leg: No edema.  Skin:    General: Skin is warm and dry.  Neurological:     General: No focal deficit present.     Mental Status: He is alert and oriented to person, place, and time. Mental status is at baseline.  Psychiatric:        Behavior: Behavior normal.          Scheduled Medications:   carvedilol  25 mg Oral BID   enoxaparin (LOVENOX) injection  40 mg Subcutaneous Q24H   guaiFENesin  600 mg Oral BID   hydrALAZINE  10 mg Oral Q8H   ipratropium-albuterol  3 mL Nebulization QID   methylPREDNISolone (SOLU-MEDROL) injection  40 mg Intravenous Q12H   Followed by   Melene Muller[START ON 08/09/2022] predniSONE  40 mg Oral Q breakfast    Continuous Infusions:  azithromycin Stopped (08/08/22 0322)   cefTRIAXone (ROCEPHIN)  IV Stopped (08/08/22 0201)    PRN Medications:  acetaminophen **OR** acetaminophen, chlorpheniramine-HYDROcodone, ipratropium-albuterol, magnesium hydroxide, ondansetron **OR** ondansetron (ZOFRAN) IV, traZODone  Antimicrobials from admission:  Anti-infectives (From admission, onward)    Start     Dose/Rate Route Frequency Ordered Stop   08/08/22 0115  cefTRIAXone (ROCEPHIN) 2 g in sodium chloride 0.9 % 100 mL IVPB        2 g 200 mL/hr over 30 Minutes Intravenous Every 24 hours 08/08/22 0107 08/13/22 0114   08/08/22 0115  azithromycin (ZITHROMAX) 500 mg in sodium chloride 0.9 % 250 mL IVPB        500 mg 250 mL/hr over 60 Minutes Intravenous Every 24 hours 08/08/22 0107  08/13/22 0114           Data Reviewed:  I have personally reviewed the following...  CBC: Recent Labs  Lab 08/07/22 2114 08/08/22 0345  WBC 8.7 5.7  NEUTROABS 7.2  --   HGB 17.1* 17.1*  HCT 53.1* 53.8*  MCV 91.1 90.1  PLT 122* 153   Basic Metabolic Panel: Recent Labs  Lab 08/07/22 2114 08/08/22 0345  NA 137 135  K 3.3* 5.0  CL 101 105  CO2 24 22  GLUCOSE 117* 115*  BUN 10 9  CREATININE 0.87 0.74  CALCIUM 9.0 8.0*   GFR: Estimated Creatinine Clearance: 106.8 mL/min (by C-G formula based on SCr of 0.74 mg/dL). Liver Function Tests: No results for input(s): "AST", "ALT", "ALKPHOS", "BILITOT", "PROT", "ALBUMIN" in the last 168 hours. No results for input(s): "LIPASE", "AMYLASE" in the last 168 hours. No results for input(s): "AMMONIA" in the last 168 hours.  Coagulation Profile: Recent Labs  Lab 08/08/22 0345  INR 1.0   Cardiac Enzymes: No results for input(s): "CKTOTAL", "CKMB", "CKMBINDEX", "TROPONINI" in the last 168 hours. BNP (last 3 results) No results for input(s): "PROBNP" in the last 8760 hours. HbA1C: No results for input(s): "HGBA1C" in the last 72 hours. CBG: No results for input(s): "GLUCAP" in the last 168 hours. Lipid Profile: No results for input(s): "CHOL", "HDL", "LDLCALC", "TRIG", "CHOLHDL", "LDLDIRECT" in the last 72 hours. Thyroid Function Tests: No results for input(s): "TSH", "T4TOTAL", "FREET4", "T3FREE", "THYROIDAB" in the last 72 hours. Anemia Panel: No results for input(s): "VITAMINB12", "FOLATE", "FERRITIN", "TIBC", "IRON", "RETICCTPCT" in the last 72 hours. Most Recent Urinalysis On File:     Component Value Date/Time   COLORURINE STRAW (A) 01/30/2016 2349   APPEARANCEUR CLEAR (A) 01/30/2016 2349   LABSPEC 1.009 01/30/2016 2349   PHURINE 6.0 01/30/2016 2349   GLUCOSEU 50 (A) 01/30/2016 2349   HGBUR NEGATIVE 01/30/2016 2349   BILIRUBINUR NEGATIVE 01/30/2016 2349   KETONESUR NEGATIVE 01/30/2016 2349   PROTEINUR NEGATIVE  01/30/2016 2349   NITRITE NEGATIVE 01/30/2016 2349   LEUKOCYTESUR NEGATIVE 01/30/2016 2349   Sepsis Labs: @LABRCNTIP (procalcitonin:4,lacticidven:4) Microbiology: Recent Results (from the past 240 hour(s))  Resp panel by RT-PCR (RSV, Flu A&B, Covid) Anterior Nasal Swab     Status: None   Collection Time: 08/07/22  9:14 PM   Specimen: Anterior Nasal Swab  Result Value Ref Range Status   SARS Coronavirus 2 by RT PCR NEGATIVE NEGATIVE Final    Comment: (NOTE) SARS-CoV-2 target nucleic acids are NOT DETECTED.  The SARS-CoV-2 RNA is generally detectable in upper respiratory specimens during the acute phase of infection. The lowest concentration of SARS-CoV-2 viral copies this assay can detect is 138 copies/mL. A negative result does not preclude SARS-Cov-2 infection and should not be used as the sole basis for treatment or other patient management decisions. A negative result may occur with  improper specimen collection/handling, submission of specimen other than nasopharyngeal swab, presence of viral mutation(s) within the areas targeted by this assay, and inadequate number of viral copies(<138 copies/mL). A negative result must be combined with clinical observations, patient history, and epidemiological information. The expected result is Negative.  Fact Sheet for Patients:  BloggerCourse.com  Fact Sheet for Healthcare Providers:  SeriousBroker.it  This test is no t yet approved or cleared by the [place] FDA and  has been authorized for detection and/or diagnosis of SARS-CoV-2 by FDA under an Emergency Use Authorization (EUA). This EUA will remain  in effect (meaning this test can be used) for the duration of the COVID-19 declaration under Section 564(b)(1) of the Act, 21 U.S.C.section 360bbb-3(b)(1), unless the authorization is terminated  or revoked sooner.       Influenza A by PCR NEGATIVE NEGATIVE Final   Influenza B  by PCR NEGATIVE NEGATIVE Final    Comment: (NOTE) The Xpert Xpress SARS-CoV-2/FLU/RSV plus assay is intended as an aid in the diagnosis of influenza from Nasopharyngeal swab specimens and should not be used as a sole basis for treatment. Nasal washings and aspirates are unacceptable for Xpert Xpress SARS-CoV-2/FLU/RSV testing.  Fact Sheet for Patients: BloggerCourse.com  Fact Sheet for Healthcare Providers: SeriousBroker.it  This test is not yet approved or cleared by the [place] FDA and has been authorized for detection and/or diagnosis of SARS-CoV-2 by FDA under an Emergency Use Authorization (EUA). This EUA will remain in effect (meaning this test can be used) for the duration of the COVID-19  declaration under Section 564(b)(1) of the Act, 21 U.S.C. section 360bbb-3(b)(1), unless the authorization is terminated or revoked.     Resp Syncytial Virus by PCR NEGATIVE NEGATIVE Final    Comment: (NOTE) Fact Sheet for Patients: BloggerCourse.com  Fact Sheet for Healthcare Providers: SeriousBroker.it  This test is not yet approved or cleared by the [place] FDA and has been authorized for detection and/or diagnosis of SARS-CoV-2 by FDA under an Emergency Use Authorization (EUA). This EUA will remain in effect (meaning this test can be used) for the duration of the COVID-19 declaration under Section 564(b)(1) of the Act, 21 U.S.C. section 360bbb-3(b)(1), unless the authorization is terminated or revoked.  Performed at [facility], [address] [place], [place] [address]   Culture, blood (x 2)     Status: None (Preliminary result)   Collection Time: 08/08/22  4:35 AM   Specimen: BLOOD  Result Value Ref Range Status   Specimen Description BLOOD RIGTH HAND  Final   Special Requests   Final    BOTTLES DRAWN AEROBIC AND ANAEROBIC Blood Culture results may not  be optimal due to an inadequate volume of blood received in culture bottles   Culture   Final    NO GROWTH <12 HOURS Performed at [facility], [address] [place], [place] [address]    Report Status PENDING  Incomplete  Culture, blood (x 2)     Status: None (Preliminary result)   Collection Time: 08/08/22  4:37 AM   Specimen: BLOOD  Result Value Ref Range Status   Specimen Description BLOOD LEFT [facility]  Final   Special Requests   Final    BOTTLES DRAWN AEROBIC AND ANAEROBIC Blood Culture adequate volume   Culture   Final    NO GROWTH <12 HOURS Performed at [facility], [address] [place], [place] [address]    Report Status PENDING  Incomplete      Radiology Studies last 3 days: DG Chest 2 View  Result Date: 08/07/2022 CLINICAL DATA:  Shortness of breath. EXAM: CHEST - 2 VIEW COMPARISON:  February 10, 2022 FINDINGS: The heart size and mediastinal contours are within normal limits. Mildly increased suprahilar and infrahilar lung markings are noted, bilaterally. In ill-defined 7 mm nodular opacity is seen overlying the right lung base. This projects over the eighth right rib and represents a new finding. Adjacent, similar appearing 3 mm and 4 mm nodular opacities are seen overlying the left lung base. There is no evidence of focal consolidation, pleural effusion or pneumothorax. The visualized skeletal structures are unremarkable. IMPRESSION: 1. Findings which may represent mild viral bronchitis versus reactive airway disease. 2. Subcentimeter bibasilar lung nodules cannot be excluded. Correlation with nonemergent chest CT is recommended. Electronically Signed   By: Aram Candela M.D.   On: 08/07/2022 21:03             LOS: 0 days      Sunnie Nielsen, DO [facility] 08/08/2022, 4:27 PM    Dictation software may have been used to generate the above note. Typos may occur and escape review in typed/dictated notes. Please contact Dr  Lyn Hollingshead directly for clarity if needed.  Staff may message me via secure chat in Epic  but this may not receive an immediate response,  please page me for urgent matters!  If 7PM-7AM, please contact night coverage [email or web address]

## 2022-08-08 NOTE — Assessment & Plan Note (Addendum)
-   Management as above. - We will hold off Dulera for now given his acute asthma exacerbation specially to avoid long-acting beta agonist and as he will be on IV steroids.

## 2022-08-08 NOTE — ED Notes (Signed)
Pt upset an irritated at this time. This RN was taking care of a critical pt when pt called out. PT sts that someone came into his room and canceled the call light and walked out. Pt sts that he needed a breathing treatment and needed to have a BM. Pt sts that he is not getting enough o2 in his nose. Pt turned his o2 up to 15l/min on a [place]. RN advised pt that the amount of o2 that he put himself on is not appropriate for his needs at this time. RN placed pt back on 4l/min and will give breathing tx. RN notified MD. Pt is now talking to MD via phone.

## 2022-08-08 NOTE — Assessment & Plan Note (Signed)
-   The patient will be admitted to a progressive unit bed. - He is currently on 100% nonrebreather and will be placed on continuous nebulized albuterol. - With lack of improvement in his respiratory status he may need BiPAP as a next step. - VBG will be obtained. - We will continue steroids with IV Solu-Medrol. - DuoNebs will be provided 4 times daily and every 4 hours as needed. - We will add mucolytic therapy.

## 2022-08-08 NOTE — ED Notes (Signed)
Pt hard stick.  RN did not want to delay pt care and receiving abx and fluid infusions to obtain blood cultures.  RN attempted to placed a secondary IV line on pt to no avail.  RN sought assistance from fellow nurse, who was able to penetrate pt vein for blood work, but unable to placed second line

## 2022-08-09 DIAGNOSIS — J9601 Acute respiratory failure with hypoxia: Secondary | ICD-10-CM | POA: Diagnosis not present

## 2022-08-09 NOTE — TOC Progression Note (Signed)
Transition of Care [facility]) - Progression Note    Patient Details  Name: Ruben Gonzalez MRN: [id number] Date of Birth: 1970-09-08  [facility] CM/SW Contact  Marlowe Sax, RN Phone Number: 08/09/2022, 10:13 AM  Clinical Narrative:     PCP [name]  Uses [facility] Lives alone and does not drive, has friends and neighbors drive him where he needs TOC  will continue to follow for needs   Expected Discharge Plan: Home/Self Care Barriers to Discharge: Continued Medical Work up  Expected Discharge Plan and Services   Discharge Planning Services: CM Consult   Living arrangements for the past 2 months: Single Family Home                                       Social Determinants of Health (SDOH) Interventions SDOH Screenings   Food Insecurity: No Food Insecurity (08/08/2022)  Housing: Low Risk  (08/08/2022)  Transportation Needs: No Transportation Needs (08/08/2022)  Utilities: Not At Risk (08/08/2022)  Alcohol Screen: Medium Risk (03/17/2022)  Depression (PHQ2-9): Low Risk  (03/17/2022)  Tobacco Use: Low Risk  (03/22/2022)    Readmission Risk Interventions     No data to display

## 2022-08-09 NOTE — Progress Notes (Signed)
PROGRESS NOTE    Jobe Criste   VWU:[id number] DOB: [birth year]  DOA: 08/07/2022 Date of Service: 08/09/22 PCP: [name], PA-C     Brief Narrative / Hospital Course:  Tyquavious Pigue is a 52 y.o. African-American male with medical history significant for asthma and hypertension, presented to the emergency room with acute onset of worsening dyspnea with associated cough and wheezing at home with no relief after taking 8-10 nebulized bronchodilators treatments at home.  04/07: He was diaphoretic and tachycardic on arrival to the hospital.  He was placed on 10 L NRB.  Required continuous nebulized albuterol.  (+)SIRS. CXR mild viral bronchitis versus reactive airway disease. CT chest: Scattered small bronchial impactions in both upper and both lower lobes, in keeping with small airways disease likely of other-than-aspiration etiology, with background central bronchitis,  focal ground-glass infiltrate in the posterior base of the right upper lobe probably due to a small pneumonia. Given DuoNeb, 2 g of IV magnesium sulfate, 10 mg of p.o. hydralazine and 12.5 mg of p.o. Coreg, 60 mg of p.o. prednisone. He was ordered continuous nebulizer albuterol and will be admitted 04/08: still hypoxic but down to 3L [place] O2, (+)substantial wheezing on breath sounds. Continue nebs and close inpatient monitoring.  04/09: still w/ significant wheezing and O2 requirement.    Consultants:  none  Procedures: none      ASSESSMENT & PLAN:   Principal Problem:   Acute respiratory failure with hypoxia Active Problems:   Acute severe exacerbation of asthma   CAP (community acquired pneumonia)   Hypertensive urgency   Acute respiratory failure with hypoxia due to asthma exacerbation and CAP SIRS d/t asthma exacerbation vs Sepsis d/t CAP 100% nonrebreather and continuous nebulized albuterol --> 4L [place] and SpO2 98% IV Solu-Medrol to transition to po prednisone  DuoNebs 4 times daily and every 4  hours as needed. mucolytic therapy. Continuous pulse ox  hold off Dulera for now given his acute asthma exacerbation specially to avoid long-acting beta agonist and as he will be on IV steroids. IV Rocephin and Zithromax follow-up cultures. May need to go home on O2  Hypertensive urgency continue his antihypertensives. Increase Coreg to 25 mg bid  as needed IV labetalol and hydralazine.    DVT prophylaxis: lovenox  Pertinent IV fluids/nutrition: no continuous IV fluids Central lines / invasive devices: none  Code Status: FULL CODE   Current Admission Status: observation (may need inpatient given respiratory failure? Will d/w utilization RN)   TOC needs / Dispo plan: anticipate d/c home to previous environment, may need home O2 Barriers to discharge / significant pending items: improvement in oxygenation              Subjective / Brief ROS:  Patient reports SOB Denies CP.  Pain controlled.  Denies new weakness.  Tolerating diet.  Reports no concerns w/ urination/defecation.   Family Communication: none at this time     Objective Findings:  Vitals:   08/08/22 2347 08/09/22 0506 08/09/22 0708 08/09/22 0859  BP: 121/86 (!) 134/95  (!) 140/87  Pulse: 92 87  95  Resp: 18   18  Temp: 97.9 F (36.6 C)   98.2 F (36.8 C)  TempSrc:    Oral  SpO2: 98%  99% 98%  Weight:      Height:        Intake/Output Summary (Last 24 hours) at 08/09/2022 1427 Last data filed at 08/09/2022 0508 Gross per 24 hour  Intake 566.28 ml  Output 500  ml  Net 66.28 ml   Filed Weights   08/07/22 2006  Weight: 77.1 kg    Examination:  Physical Exam Constitutional:      General: He is not in acute distress.    Appearance: Normal appearance.  Cardiovascular:     Rate and Rhythm: Normal rate and regular rhythm.     Heart sounds: Normal heart sounds.  Pulmonary:     Effort: No tachypnea, accessory muscle usage or respiratory distress.     Breath sounds: Examination of the  right-upper field reveals wheezing and rhonchi. Examination of the left-upper field reveals wheezing and rhonchi. Examination of the right-middle field reveals wheezing and rhonchi. Examination of the left-middle field reveals wheezing and rhonchi. Examination of the right-lower field reveals decreased breath sounds. Examination of the left-lower field reveals decreased breath sounds. Decreased breath sounds, wheezing and rhonchi present.  Abdominal:     General: Abdomen is flat. Bowel sounds are normal.     Palpations: Abdomen is soft.  Musculoskeletal:     Right lower leg: No edema.     Left lower leg: No edema.  Skin:    General: Skin is warm and dry.  Neurological:     General: No focal deficit present.     Mental Status: He is alert and oriented to person, place, and time. Mental status is at baseline.  Psychiatric:        Behavior: Behavior normal.          Scheduled Medications:   carvedilol  25 mg Oral BID   enoxaparin (LOVENOX) injection  40 mg Subcutaneous Q24H   guaiFENesin  600 mg Oral BID   hydrALAZINE  10 mg Oral Q8H   ipratropium-albuterol  3 mL Nebulization QID   predniSONE  40 mg Oral Q breakfast    Continuous Infusions:  azithromycin 500 mg (08/09/22 0136)   cefTRIAXone (ROCEPHIN)  IV 2 g (08/09/22 0029)    PRN Medications:  acetaminophen **OR** acetaminophen, chlorpheniramine-HYDROcodone, ipratropium-albuterol, magnesium hydroxide, ondansetron **OR** ondansetron (ZOFRAN) IV, traZODone  Antimicrobials from admission:  Anti-infectives (From admission, onward)    Start     Dose/Rate Route Frequency Ordered Stop   08/08/22 0115  cefTRIAXone (ROCEPHIN) 2 g in sodium chloride 0.9 % 100 mL IVPB        2 g 200 mL/hr over 30 Minutes Intravenous Every 24 hours 08/08/22 0107 08/13/22 0114   08/08/22 0115  azithromycin (ZITHROMAX) 500 mg in sodium chloride 0.9 % 250 mL IVPB        500 mg 250 mL/hr over 60 Minutes Intravenous Every 24 hours 08/08/22 0107 08/13/22  0114           Data Reviewed:  I have personally reviewed the following...  CBC: Recent Labs  Lab 08/07/22 2114 08/08/22 0345  WBC 8.7 5.7  NEUTROABS 7.2  --   HGB 17.1* 17.1*  HCT 53.1* 53.8*  MCV 91.1 90.1  PLT 122* 153   Basic Metabolic Panel: Recent Labs  Lab 08/07/22 2114 08/08/22 0345  NA 137 135  K 3.3* 5.0  CL 101 105  CO2 24 22  GLUCOSE 117* 115*  BUN 10 9  CREATININE 0.87 0.74  CALCIUM 9.0 8.0*   GFR: Estimated Creatinine Clearance: 106.8 mL/min (by C-G formula based on SCr of 0.74 mg/dL). Liver Function Tests: No results for input(s): "AST", "ALT", "ALKPHOS", "BILITOT", "PROT", "ALBUMIN" in the last 168 hours. No results for input(s): "LIPASE", "AMYLASE" in the last 168 hours. No results for input(s): "AMMONIA"  in the last 168 hours. Coagulation Profile: Recent Labs  Lab 08/08/22 0345  INR 1.0   Cardiac Enzymes: No results for input(s): "CKTOTAL", "CKMB", "CKMBINDEX", "TROPONINI" in the last 168 hours. BNP (last 3 results) No results for input(s): "PROBNP" in the last 8760 hours. HbA1C: No results for input(s): "HGBA1C" in the last 72 hours. CBG: No results for input(s): "GLUCAP" in the last 168 hours. Lipid Profile: No results for input(s): "CHOL", "HDL", "LDLCALC", "TRIG", "CHOLHDL", "LDLDIRECT" in the last 72 hours. Thyroid Function Tests: No results for input(s): "TSH", "T4TOTAL", "FREET4", "T3FREE", "THYROIDAB" in the last 72 hours. Anemia Panel: No results for input(s): "VITAMINB12", "FOLATE", "FERRITIN", "TIBC", "IRON", "RETICCTPCT" in the last 72 hours. Most Recent Urinalysis On File:     Component Value Date/Time   COLORURINE STRAW (A) 01/30/2016 2349   APPEARANCEUR CLEAR (A) 01/30/2016 2349   LABSPEC 1.009 01/30/2016 2349   PHURINE 6.0 01/30/2016 2349   GLUCOSEU 50 (A) 01/30/2016 2349   HGBUR NEGATIVE 01/30/2016 2349   BILIRUBINUR NEGATIVE 01/30/2016 2349   KETONESUR NEGATIVE 01/30/2016 2349   PROTEINUR NEGATIVE  01/30/2016 2349   NITRITE NEGATIVE 01/30/2016 2349   LEUKOCYTESUR NEGATIVE 01/30/2016 2349   Sepsis Labs: @LABRCNTIP (procalcitonin:4,lacticidven:4) Microbiology: Recent Results (from the past 240 hour(s))  Resp panel by RT-PCR (RSV, Flu A&B, Covid) Anterior Nasal Swab     Status: None   Collection Time: 08/07/22  9:14 PM   Specimen: Anterior Nasal Swab  Result Value Ref Range Status   SARS Coronavirus 2 by RT PCR NEGATIVE NEGATIVE Final    Comment: (NOTE) SARS-CoV-2 target nucleic acids are NOT DETECTED.  The SARS-CoV-2 RNA is generally detectable in upper respiratory specimens during the acute phase of infection. The lowest concentration of SARS-CoV-2 viral copies this assay can detect is 138 copies/mL. A negative result does not preclude SARS-Cov-2 infection and should not be used as the sole basis for treatment or other patient management decisions. A negative result may occur with  improper specimen collection/handling, submission of specimen other than nasopharyngeal swab, presence of viral mutation(s) within the areas targeted by this assay, and inadequate number of viral copies(<138 copies/mL). A negative result must be combined with clinical observations, patient history, and epidemiological information. The expected result is Negative.  Fact Sheet for Patients:  BloggerCourse.com  Fact Sheet for Healthcare Providers:  SeriousBroker.it  This test is no t yet approved or cleared by the [place] FDA and  has been authorized for detection and/or diagnosis of SARS-CoV-2 by FDA under an Emergency Use Authorization (EUA). This EUA will remain  in effect (meaning this test can be used) for the duration of the COVID-19 declaration under Section 564(b)(1) of the Act, 21 U.S.C.section 360bbb-3(b)(1), unless the authorization is terminated  or revoked sooner.       Influenza A by PCR NEGATIVE NEGATIVE Final   Influenza B  by PCR NEGATIVE NEGATIVE Final    Comment: (NOTE) The Xpert Xpress SARS-CoV-2/FLU/RSV plus assay is intended as an aid in the diagnosis of influenza from Nasopharyngeal swab specimens and should not be used as a sole basis for treatment. Nasal washings and aspirates are unacceptable for Xpert Xpress SARS-CoV-2/FLU/RSV testing.  Fact Sheet for Patients: BloggerCourse.com  Fact Sheet for Healthcare Providers: SeriousBroker.it  This test is not yet approved or cleared by the [place] FDA and has been authorized for detection and/or diagnosis of SARS-CoV-2 by FDA under an Emergency Use Authorization (EUA). This EUA will remain in effect (meaning this test can be used) for  the duration of the COVID-19 declaration under Section 564(b)(1) of the Act, 21 U.S.C. section 360bbb-3(b)(1), unless the authorization is terminated or revoked.     Resp Syncytial Virus by PCR NEGATIVE NEGATIVE Final    Comment: (NOTE) Fact Sheet for Patients: BloggerCourse.com  Fact Sheet for Healthcare Providers: SeriousBroker.it  This test is not yet approved or cleared by the [place] FDA and has been authorized for detection and/or diagnosis of SARS-CoV-2 by FDA under an Emergency Use Authorization (EUA). This EUA will remain in effect (meaning this test can be used) for the duration of the COVID-19 declaration under Section 564(b)(1) of the Act, 21 U.S.C. section 360bbb-3(b)(1), unless the authorization is terminated or revoked.  Performed at [facility], [address] [place], [place] [address]   Culture, blood (x 2)     Status: None (Preliminary result)   Collection Time: 08/08/22  4:35 AM   Specimen: BLOOD  Result Value Ref Range Status   Specimen Description BLOOD RIGTH HAND  Final   Special Requests   Final    BOTTLES DRAWN AEROBIC AND ANAEROBIC Blood Culture results may not  be optimal due to an inadequate volume of blood received in culture bottles   Culture   Final    NO GROWTH 1 DAY Performed at [facility], [address], [place], [place] [address]    Report Status PENDING  Incomplete  Culture, blood (x 2)     Status: None (Preliminary result)   Collection Time: 08/08/22  4:37 AM   Specimen: BLOOD  Result Value Ref Range Status   Specimen Description BLOOD LEFT [facility]  Final   Special Requests   Final    BOTTLES DRAWN AEROBIC AND ANAEROBIC Blood Culture adequate volume   Culture   Final    NO GROWTH 1 DAY Performed at [facility], [address], [place], [place] [address]    Report Status PENDING  Incomplete      Radiology Studies last 3 days: DG Chest 2 View  Result Date: 08/07/2022 CLINICAL DATA:  Shortness of breath. EXAM: CHEST - 2 VIEW COMPARISON:  February 10, 2022 FINDINGS: The heart size and mediastinal contours are within normal limits. Mildly increased suprahilar and infrahilar lung markings are noted, bilaterally. In ill-defined 7 mm nodular opacity is seen overlying the right lung base. This projects over the eighth right rib and represents a new finding. Adjacent, similar appearing 3 mm and 4 mm nodular opacities are seen overlying the left lung base. There is no evidence of focal consolidation, pleural effusion or pneumothorax. The visualized skeletal structures are unremarkable. IMPRESSION: 1. Findings which may represent mild viral bronchitis versus reactive airway disease. 2. Subcentimeter bibasilar lung nodules cannot be excluded. Correlation with nonemergent chest CT is recommended. Electronically Signed   By: Aram Candela M.D.   On: 08/07/2022 21:03             LOS: 1 day      Sunnie Nielsen, DO [facility] 08/09/2022, 2:27 PM    Dictation software may have been used to generate the above note. Typos may occur and escape review in typed/dictated notes. Please contact Dr Lyn Hollingshead  directly for clarity if needed.  Staff may message me via secure chat in Epic  but this may not receive an immediate response,  please page me for urgent matters!  If 7PM-7AM, please contact night coverage [email or web address]

## 2022-08-10 DIAGNOSIS — J9601 Acute respiratory failure with hypoxia: Secondary | ICD-10-CM | POA: Diagnosis not present

## 2022-08-10 MED ORDER — HYDRALAZINE HCL 25 MG PO TABS: 25.0000 mg | ORAL_TABLET | Freq: Four times a day (QID) | ORAL | 11 refills | Status: AC

## 2022-08-10 MED ORDER — PREDNISONE 20 MG PO TABS: 40.0000 mg | ORAL_TABLET | Freq: Every day | ORAL | 0 refills | Status: AC

## 2022-08-10 MED ORDER — GUAIFENESIN ER 600 MG PO TB12: 600.0000 mg | ORAL_TABLET | Freq: Two times a day (BID) | ORAL | 0 refills | Status: AC

## 2022-08-10 MED ORDER — CARVEDILOL 25 MG PO TABS: 25.0000 mg | ORAL_TABLET | Freq: Two times a day (BID) | ORAL | 1 refills | Status: DC

## 2022-08-10 MED ORDER — IPRATROPIUM-ALBUTEROL 0.5-2.5 (3) MG/3ML IN SOLN: 3.0000 mL | Freq: Three times a day (TID) | RESPIRATORY_TRACT | Status: DC

## 2022-08-10 MED ORDER — CEFDINIR 300 MG PO CAPS: 300.0000 mg | ORAL_CAPSULE | Freq: Two times a day (BID) | ORAL | 0 refills | Status: AC

## 2022-08-10 NOTE — Discharge Summary (Signed)
Physician Discharge Summary  Alezander Gonzalez ATF:[id number] DOB: Nov 15, 1970 DOA: 08/07/2022  PCP: [name], PA-C  Admit date: 08/07/2022  Discharge date: 08/10/2022  Admitted From: Home.  Disposition:  Home  Recommendations for Outpatient Follow-up:  Follow up with PCP in 1-2 weeks. Please obtain BMP/CBC in one week. Advised to take prednisone 40 mg daily for 3 days for asthma exacerbation. Advised to take Omnicef 300 mg twice daily for 3 days for CAP. Advised to continue Coreg 25 mg twice daily for hypertension. Advised to take hydralazine 25 mg every 8 hours for hypertension.  Home Health: None Equipment/Devices:None  Discharge Condition: Stable CODE STATUS:Full code Diet recommendation: Heart Healthy   Brief Summary/ Hospital Course: Ruben Gonzalez is a 52 y.o. African-American male with medical history significant for asthma and hypertension, presented to the emergency room with acute onset of worsening dyspnea with associated cough and wheezing at home with no relief after taking 8-10 nebulized bronchodilators treatments at home.  04/07: He was diaphoretic and tachycardic on arrival to the hospital.  He was placed on 10 L NRB.  Required continuous nebulized albuterol.  (+)SIRS. CXR mild viral bronchitis versus reactive airway disease. CT chest: Scattered small bronchial impactions in both upper and both lower lobes, in keeping with small airways disease likely of other-than-aspiration etiology, with background central bronchitis,  focal ground-glass infiltrate in the posterior base of the right upper lobe probably due to a small pneumonia. Given DuoNeb, 2 g of IV magnesium sulfate, 10 mg of p.o. hydralazine and 12.5 mg of p.o. Coreg, 60 mg of p.o. prednisone. He was ordered continuous nebulizer albuterol and will be admitted 04/08: still hypoxic but down to 3L [place] O2, (+)substantial wheezing on breath sounds. Continue nebs and close inpatient monitoring.  04/09: still w/  significant wheezing and O2 requirement.  04/10: Feeling much improved.  Still has mild wheezing on back of the chest.  Ambulated without need for oxygen. Patient is being discharged home.  Discharge Diagnoses:  Principal Problem:   Acute respiratory failure with hypoxia Active Problems:   Asthma exacerbation   Acute severe exacerbation of asthma   CAP (community acquired pneumonia)   Hypertensive urgency  Acute respiratory failure with hypoxia due to asthma exacerbation and CAP SIRS d/t asthma exacerbation vs Sepsis d/t CAP 100% nonrebreather and continuous nebulized albuterol --> 4L [place] and SpO2 98% IV Solu-Medrol transitioned to po prednisone  DuoNebs 4 times daily and every 4 hours as needed. mucolytic therapy. Continuous pulse ox  hold off Dulera for now given his acute asthma exacerbation specially to avoid long-acting beta agonist and as he will be on IV steroids. IV Rocephin and Zithromax follow-up cultures. Ambulated without oxygen requirement.  Feels better wants to be discharged.   Patient being discharged home on Omnicef and prednisone.   Hypertensive urgency continue his antihypertensives. Increase Coreg to 25 mg bid  as needed IV labetalol and hydralazine.  Discharge Instructions  Discharge Instructions     Call MD for:  difficulty breathing, headache or visual disturbances   Complete by: As directed    Call MD for:  persistant dizziness or light-headedness   Complete by: As directed    Call MD for:  persistant nausea and vomiting   Complete by: As directed    Diet - low sodium heart healthy   Complete by: As directed    Diet Carb Modified   Complete by: As directed    Discharge instructions   Complete by: As directed    Advised  to follow-up with primary care physician in 1 week. Advised to take prednisone 40 mg daily for 3 days for asthma exacerbation. Advised to take Omnicef 300 mg twice daily for 3 days for CAP. Advised to continue Coreg 25 mg twice  daily for hypertension. Is to take hydralazine 25 mg every 8 hours for hypertension   Increase activity slowly   Complete by: As directed       Allergies as of 08/10/2022   No Known Allergies      Medication List     STOP taking these medications    Dulera 100-5 MCG/ACT Aero Generic drug: mometasone-formoterol       TAKE these medications    albuterol (2.5 MG/3ML) 0.083% nebulizer solution Commonly known as: PROVENTIL Take 3 mLs (2.5 mg total) by nebulization every 6 (six) hours as needed.   albuterol 108 (90 Base) MCG/ACT inhaler Commonly known as: VENTOLIN HFA Inhale 2 puffs into the lungs every 6 (six) hours as needed for wheezing or shortness of breath   carvedilol 25 MG tablet Commonly known as: COREG Take 1 tablet (25 mg total) by mouth 2 (two) times daily. What changed:  medication strength how much to take   cefdinir 300 MG capsule Commonly known as: OMNICEF Take 1 capsule (300 mg total) by mouth 2 (two) times daily for 3 days.   guaiFENesin 600 MG 12 hr tablet Commonly known as: MUCINEX Take 1 tablet (600 mg total) by mouth 2 (two) times daily for 3 days.   hydrALAZINE 25 MG tablet Commonly known as: APRESOLINE Take 1 tablet (25 mg total) by mouth 4 (four) times daily. What changed:  medication strength how much to take when to take this   predniSONE 20 MG tablet Commonly known as: DELTASONE Take 2 tablets (40 mg total) by mouth daily with breakfast for 3 days. Start taking on: August 11, 2022        Follow-up Information     [facility], Edmon Crape, [place] Follow up in 1 week(s).   Specialty: Physician Assistant Contact information: [address] [place] [address] [phone]         [name], PA-C Follow up on 08/17/2022.   Specialty: Physician Assistant Why: at [employer] information: [address] [place] [address] [phone]                No Known  Allergies  Consultations: None   Procedures/Studies: DG Chest 2 View  Result Date: 08/07/2022 CLINICAL DATA:  Shortness of breath. EXAM: CHEST - 2 VIEW COMPARISON:  February 10, 2022 FINDINGS: The heart size and mediastinal contours are within normal limits. Mildly increased suprahilar and infrahilar lung markings are noted, bilaterally. In ill-defined 7 mm nodular opacity is seen overlying the right lung base. This projects over the eighth right rib and represents a new finding. Adjacent, similar appearing 3 mm and 4 mm nodular opacities are seen overlying the left lung base. There is no evidence of focal consolidation, pleural effusion or pneumothorax. The visualized skeletal structures are unremarkable. IMPRESSION: 1. Findings which may represent mild viral bronchitis versus reactive airway disease. 2. Subcentimeter bibasilar lung nodules cannot be excluded. Correlation with nonemergent chest CT is recommended. Electronically Signed   By: Aram Candela M.D.   On: 08/07/2022 21:03    Subjective: Patient was seen and examined at bedside.  Overnight events noted.   Patient reports doing much better, He wants to be discharged.  Breathing has significantly improved.   He does not  require oxygen and ambulated without problems.  He wants to be discharged.  Discharge Exam: Vitals:   08/10/22 0804 08/10/22 0816  BP:  (!) 168/127  Pulse: 85 80  Resp: 16 20  Temp:  98.5 F (36.9 C)  SpO2: 97% 96%   Vitals:   08/09/22 2323 08/10/22 0043 08/10/22 0804 08/10/22 0816  BP: (!) 151/111 (!) 137/98  (!) 168/127  Pulse: 85 85 85 80  Resp: 20  16 20   Temp: 98.1 F (36.7 C)   98.5 F (36.9 C)  TempSrc:    Axillary  SpO2: 99%  97% 96%  Weight:      Height:        General: Pt is alert, awake, not in acute distress Cardiovascular: RRR, S1/S2 +, no rubs, no gallops Respiratory: CTA bilaterally, no wheezing, no rhonchi Abdominal: Soft, NT, ND, bowel sounds + Extremities: no edema, no  cyanosis    The results of significant diagnostics from this hospitalization (including imaging, microbiology, ancillary and laboratory) are listed below for reference.     Microbiology: Recent Results (from the past 240 hour(s))  Resp panel by RT-PCR (RSV, Flu A&B, Covid) Anterior Nasal Swab     Status: None   Collection Time: 08/07/22  9:14 PM   Specimen: Anterior Nasal Swab  Result Value Ref Range Status   SARS Coronavirus 2 by RT PCR NEGATIVE NEGATIVE Final    Comment: (NOTE) SARS-CoV-2 target nucleic acids are NOT DETECTED.  The SARS-CoV-2 RNA is generally detectable in upper respiratory specimens during the acute phase of infection. The lowest concentration of SARS-CoV-2 viral copies this assay can detect is 138 copies/mL. A negative result does not preclude SARS-Cov-2 infection and should not be used as the sole basis for treatment or other patient management decisions. A negative result may occur with  improper specimen collection/handling, submission of specimen other than nasopharyngeal swab, presence of viral mutation(s) within the areas targeted by this assay, and inadequate number of viral copies(<138 copies/mL). A negative result must be combined with clinical observations, patient history, and epidemiological information. The expected result is Negative.  Fact Sheet for Patients:  BloggerCourse.com  Fact Sheet for Healthcare Providers:  SeriousBroker.it  This test is no t yet approved or cleared by the [place] FDA and  has been authorized for detection and/or diagnosis of SARS-CoV-2 by FDA under an Emergency Use Authorization (EUA). This EUA will remain  in effect (meaning this test can be used) for the duration of the COVID-19 declaration under Section 564(b)(1) of the Act, 21 U.S.C.section 360bbb-3(b)(1), unless the authorization is terminated  or revoked sooner.       Influenza A by PCR NEGATIVE  NEGATIVE Final   Influenza B by PCR NEGATIVE NEGATIVE Final    Comment: (NOTE) The Xpert Xpress SARS-CoV-2/FLU/RSV plus assay is intended as an aid in the diagnosis of influenza from Nasopharyngeal swab specimens and should not be used as a sole basis for treatment. Nasal washings and aspirates are unacceptable for Xpert Xpress SARS-CoV-2/FLU/RSV testing.  Fact Sheet for Patients: BloggerCourse.com  Fact Sheet for Healthcare Providers: SeriousBroker.it  This test is not yet approved or cleared by the [place] FDA and has been authorized for detection and/or diagnosis of SARS-CoV-2 by FDA under an Emergency Use Authorization (EUA). This EUA will remain in effect (meaning this test can be used) for the duration of the COVID-19 declaration under Section 564(b)(1) of the Act, 21 U.S.C. section 360bbb-3(b)(1), unless the authorization is terminated or revoked.  Resp Syncytial Virus by PCR NEGATIVE NEGATIVE Final    Comment: (NOTE) Fact Sheet for Patients: BloggerCourse.com  Fact Sheet for Healthcare Providers: SeriousBroker.it  This test is not yet approved or cleared by the [place] FDA and has been authorized for detection and/or diagnosis of SARS-CoV-2 by FDA under an Emergency Use Authorization (EUA). This EUA will remain in effect (meaning this test can be used) for the duration of the COVID-19 declaration under Section 564(b)(1) of the Act, 21 U.S.C. section 360bbb-3(b)(1), unless the authorization is terminated or revoked.  Performed at [facility], [address] [place], [place] [address]   Culture, blood (x 2)     Status: None (Preliminary result)   Collection Time: 08/08/22  4:35 AM   Specimen: BLOOD  Result Value Ref Range Status   Specimen Description BLOOD RIGTH HAND  Final   Special Requests   Final    BOTTLES DRAWN AEROBIC AND ANAEROBIC  Blood Culture results may not be optimal due to an inadequate volume of blood received in culture bottles   Culture   Final    NO GROWTH 2 DAYS Performed at [facility] [place], [address], [place], [place] [address]    Report Status PENDING  Incomplete  Culture, blood (x 2)     Status: None (Preliminary result)   Collection Time: 08/08/22  4:37 AM   Specimen: BLOOD  Result Value Ref Range Status   Specimen Description BLOOD LEFT [facility]  Final   Special Requests   Final    BOTTLES DRAWN AEROBIC AND ANAEROBIC Blood Culture adequate volume   Culture   Final    NO GROWTH 2 DAYS Performed at [facility], [address], [place], [place] [address]    Report Status PENDING  Incomplete     Labs: BNP (last 3 results) No results for input(s): "BNP" in the last 8760 hours. Basic Metabolic Panel: Recent Labs  Lab 08/07/22 2114 08/08/22 0345  NA 137 135  K 3.3* 5.0  CL 101 105  CO2 24 22  GLUCOSE 117* 115*  BUN 10 9  CREATININE 0.87 0.74  CALCIUM 9.0 8.0*   Liver Function Tests: No results for input(s): "AST", "ALT", "ALKPHOS", "BILITOT", "PROT", "ALBUMIN" in the last 168 hours. No results for input(s): "LIPASE", "AMYLASE" in the last 168 hours. No results for input(s): "AMMONIA" in the last 168 hours. CBC: Recent Labs  Lab 08/07/22 2114 08/08/22 0345  WBC 8.7 5.7  NEUTROABS 7.2  --   HGB 17.1* 17.1*  HCT 53.1* 53.8*  MCV 91.1 90.1  PLT 122* 153   Cardiac Enzymes: No results for input(s): "CKTOTAL", "CKMB", "CKMBINDEX", "TROPONINI" in the last 168 hours. BNP: Invalid input(s): "POCBNP" CBG: No results for input(s): "GLUCAP" in the last 168 hours. D-Dimer No results for input(s): "DDIMER" in the last 72 hours. Hgb A1c No results for input(s): "HGBA1C" in the last 72 hours. Lipid Profile No results for input(s): "CHOL", "HDL", "LDLCALC", "TRIG", "CHOLHDL", "LDLDIRECT" in the last 72 hours. Thyroid function studies No results for input(s): "TSH",  "T4TOTAL", "T3FREE", "THYROIDAB" in the last 72 hours.  Invalid input(s): "FREET3" Anemia work up No results for input(s): "VITAMINB12", "FOLATE", "FERRITIN", "TIBC", "IRON", "RETICCTPCT" in the last 72 hours. Urinalysis    Component Value Date/Time   COLORURINE STRAW (A) 01/30/2016 2349   APPEARANCEUR CLEAR (A) 01/30/2016 2349   LABSPEC 1.009 01/30/2016 2349   PHURINE 6.0 01/30/2016 2349   GLUCOSEU 50 (A) 01/30/2016 2349   HGBUR NEGATIVE 01/30/2016  2349   BILIRUBINUR NEGATIVE 01/30/2016 2349   KETONESUR NEGATIVE 01/30/2016 2349   PROTEINUR NEGATIVE 01/30/2016 2349   NITRITE NEGATIVE 01/30/2016 2349   LEUKOCYTESUR NEGATIVE 01/30/2016 2349   Sepsis Labs Recent Labs  Lab 08/07/22 2114 08/08/22 0345  WBC 8.7 5.7   Microbiology Recent Results (from the past 240 hour(s))  Resp panel by RT-PCR (RSV, Flu A&B, Covid) Anterior Nasal Swab     Status: None   Collection Time: 08/07/22  9:14 PM   Specimen: Anterior Nasal Swab  Result Value Ref Range Status   SARS Coronavirus 2 by RT PCR NEGATIVE NEGATIVE Final    Comment: (NOTE) SARS-CoV-2 target nucleic acids are NOT DETECTED.  The SARS-CoV-2 RNA is generally detectable in upper respiratory specimens during the acute phase of infection. The lowest concentration of SARS-CoV-2 viral copies this assay can detect is 138 copies/mL. A negative result does not preclude SARS-Cov-2 infection and should not be used as the sole basis for treatment or other patient management decisions. A negative result may occur with  improper specimen collection/handling, submission of specimen other than nasopharyngeal swab, presence of viral mutation(s) within the areas targeted by this assay, and inadequate number of viral copies(<138 copies/mL). A negative result must be combined with clinical observations, patient history, and epidemiological information. The expected result is Negative.  Fact Sheet for Patients:   BloggerCourse.comhttps://www.fda.gov/media/152166/download  Fact Sheet for Healthcare Providers:  SeriousBroker.ithttps://www.fda.gov/media/152162/download  This test is no t yet approved or cleared by the [place] FDA and  has been authorized for detection and/or diagnosis of SARS-CoV-2 by FDA under an Emergency Use Authorization (EUA). This EUA will remain  in effect (meaning this test can be used) for the duration of the COVID-19 declaration under Section 564(b)(1) of the Act, 21 U.S.C.section 360bbb-3(b)(1), unless the authorization is terminated  or revoked sooner.       Influenza A by PCR NEGATIVE NEGATIVE Final   Influenza B by PCR NEGATIVE NEGATIVE Final    Comment: (NOTE) The Xpert Xpress SARS-CoV-2/FLU/RSV plus assay is intended as an aid in the diagnosis of influenza from Nasopharyngeal swab specimens and should not be used as a sole basis for treatment. Nasal washings and aspirates are unacceptable for Xpert Xpress SARS-CoV-2/FLU/RSV testing.  Fact Sheet for Patients: BloggerCourse.comhttps://www.fda.gov/media/152166/download  Fact Sheet for Healthcare Providers: SeriousBroker.ithttps://www.fda.gov/media/152162/download  This test is not yet approved or cleared by the [place] FDA and has been authorized for detection and/or diagnosis of SARS-CoV-2 by FDA under an Emergency Use Authorization (EUA). This EUA will remain in effect (meaning this test can be used) for the duration of the COVID-19 declaration under Section 564(b)(1) of the Act, 21 U.S.C. section 360bbb-3(b)(1), unless the authorization is terminated or revoked.     Resp Syncytial Virus by PCR NEGATIVE NEGATIVE Final    Comment: (NOTE) Fact Sheet for Patients: BloggerCourse.comhttps://www.fda.gov/media/152166/download  Fact Sheet for Healthcare Providers: SeriousBroker.ithttps://www.fda.gov/media/152162/download  This test is not yet approved or cleared by the [place] FDA and has been authorized for detection and/or diagnosis of SARS-CoV-2 by FDA under an Emergency Use  Authorization (EUA). This EUA will remain in effect (meaning this test can be used) for the duration of the COVID-19 declaration under Section 564(b)(1) of the Act, 21 U.S.C. section 360bbb-3(b)(1), unless the authorization is terminated or revoked.  Performed at [facility], [address] [place], [place] [address]   Culture, blood (x 2)     Status: None (Preliminary result)   Collection Time: 08/08/22  4:35 AM  Specimen: BLOOD  Result Value Ref Range Status   Specimen Description BLOOD RIGTH HAND  Final   Special Requests   Final    BOTTLES DRAWN AEROBIC AND ANAEROBIC Blood Culture results may not be optimal due to an inadequate volume of blood received in culture bottles   Culture   Final    NO GROWTH 2 DAYS Performed at [facility], [address], [place], [place] [address]    Report Status PENDING  Incomplete  Culture, blood (x 2)     Status: None (Preliminary result)   Collection Time: 08/08/22  4:37 AM   Specimen: BLOOD  Result Value Ref Range Status   Specimen Description BLOOD LEFT [facility]  Final   Special Requests   Final    BOTTLES DRAWN AEROBIC AND ANAEROBIC Blood Culture adequate volume   Culture   Final    NO GROWTH 2 DAYS Performed at [facility], [address], [place], [place] [address]    Report Status PENDING  Incomplete     Time coordinating discharge: Over 30 minutes  SIGNED:   Willeen Niece, MD  [facility] 08/10/2022, 3:46 PM Pager   If 7PM-7AM, please contact night-coverage

## 2022-08-10 NOTE — Progress Notes (Signed)
SATURATION QUALIFICATIONS: (This note is used to comply with regulatory documentation for home oxygen)  Patient Saturations on Room Air at Rest = 95%  Patient Saturations on Room Air while Ambulating = 97%   

## 2022-08-11 ENCOUNTER — Telehealth: Payer: Self-pay

## 2022-08-11 LAB — CULTURE, BLOOD (ROUTINE X 2): Special Requests: ADEQUATE

## 2022-08-11 NOTE — Transitions of Care (Post Inpatient/ED Visit) (Signed)
   08/11/2022  Name: Ruben Gonzalez MRN: [id number] DOB: [birth year]  Today's TOC FU Call Status: Today's TOC FU Call Status:: Unsuccessul Call (1st Attempt) Unsuccessful Call (1st Attempt) Date: 08/11/22  Attempted to reach the patient regarding the most recent Inpatient/ED visit.  Follow Up Plan: Additional outreach attempts will be made to reach the patient to complete the Transitions of Care (Post Inpatient/ED visit) call.   Signature Karena Addison, LPN [facility] Nurse Health Advisor Direct Dial [phone]

## 2022-08-12 LAB — CULTURE, BLOOD (ROUTINE X 2): Culture: NO GROWTH

## 2022-08-12 NOTE — Telephone Encounter (Signed)
Copied from CRM [id number]. Topic: Appointment Scheduling - Scheduling Inquiry for Clinic >> Aug 12, 2022 10:25 AM Benetta Spar B wrote: Reason for CRM: Pt called in , has appt on April 17 for hosp fu and also an appt on April 26 for office visit. Can these two be combined into one? Please call back

## 2022-08-13 LAB — CULTURE, BLOOD (ROUTINE X 2): Culture: NO GROWTH

## 2022-08-17 ENCOUNTER — Inpatient Hospital Stay: Payer: 59 | Admitting: Physician Assistant

## 2022-08-23 ENCOUNTER — Other Ambulatory Visit: Payer: Self-pay

## 2022-08-26 ENCOUNTER — Ambulatory Visit: Payer: 59 | Admitting: Physician Assistant

## 2022-09-01 ENCOUNTER — Telehealth: Payer: Self-pay | Admitting: Physician Assistant

## 2022-09-01 NOTE — Telephone Encounter (Signed)
[facility] pharmacy faxed refill request for the following medications:   albuterol (PROVENTIL) (2.5 MG/3ML) 0.083% nebulizer solution    Please advise

## 2022-09-01 NOTE — Progress Notes (Unsigned)
Vivien Rota DeSanto,acting as a Neurosurgeon for [employer], PA-C.,have documented all relevant documentation on the behalf of [name], PA-C,as directed by  [employer], PA-C while in the presence of [employer], PA-C.    Established patient visit   Patient: Ruben Gonzalez   DOB: 28-Jun-1970   52 y.o. Male  MRN: [id number] Visit Date: 09/02/2022  Today's healthcare provider: [name], PA-C   No chief complaint on file.  Subjective    HPI  Follow up Hospitalization  Patient was admitted to [facility] on 08/07/22 and discharged on 08/10/22. He was treated for Uncontrolled hypertension and exacerbation of asthma.  Treatment included Acute respiratory failure with hypoxia due to asthma exacerbation and CAP SIRS d/t asthma exacerbation vs Sepsis d/t CAP 100% nonrebreather and continuous nebulized albuterol --> 4L [place] and SpO2 98% IV Solu-Medrol transitioned to po prednisone  DuoNebs 4 times daily and every 4 hours as needed. mucolytic therapy. Continuous pulse ox  hold off Dulera for now given his acute asthma exacerbation specially to avoid long-acting beta agonist and as he will be on IV steroids. IV Rocephin and Zithromax follow-up cultures. Ambulated without oxygen requirement.  Feels better wants to be discharged.   Patient being discharged home on Omnicef and prednisone.   Hypertensive urgency continue his antihypertensives. Increase Coreg to 25 mg bid  as needed IV labetalol and hydralazine.  Discharge Diagnoses:  Principal Problem:   Acute respiratory failure with hypoxia Active Problems:   Asthma exacerbation   Acute severe exacerbation of asthma   CAP (community acquired pneumonia)   Hypertensive urgency  Recommendations for Outpatient Follow-up:  Follow up with PCP in 1-2 weeks. Please obtain BMP/CBC in one week. Advised to take prednisone 40 mg daily for 3 days for asthma exacerbation. Advised to take Omnicef 300 mg twice daily for 3 days for  CAP. Advised to continue Coreg 25 mg twice daily for hypertension. Advised to take hydralazine 25 mg every 8 hours for hypertension.  Discharge Instructions   Call MD for:  difficulty breathing, headache or visual disturbances   Complete by: As directed      Call MD for:  persistant dizziness or light-headedness   Complete by: As directed      Call MD for:  persistant nausea and vomiting   Complete by: As directed      Diet - low sodium heart healthy   Complete by: As directed      Diet Carb Modified   Complete by: As directed      Discharge instructions   Complete by: As directed      Advised to follow-up with primary care physician in 1 week. Advised to take prednisone 40 mg daily for 3 days for asthma exacerbation. Advised to take Omnicef 300 mg twice daily for 3 days for CAP. Advised to continue Coreg 25 mg twice daily for hypertension. Is to take hydralazine 25 mg every 8 hours for hypertension    Increase activity slowly   Complete by: As directed   ----------------------------------------------------------------------------------------- - Patient states he is doing well, his symptoms have resolved aand he is back at work.   Medications: Outpatient Medications Prior to Visit  Medication Sig   albuterol (PROVENTIL) (2.5 MG/3ML) 0.083% nebulizer solution Take 3 mLs (2.5 mg total) by nebulization every 6 (six) hours as needed.   albuterol (VENTOLIN HFA) 108 (90 Base) MCG/ACT inhaler Inhale 2 puffs into the lungs every 6 (six) hours as needed for wheezing or shortness of breath  carvedilol (COREG) 25 MG tablet Take 1 tablet (25 mg total) by mouth 2 (two) times daily.   hydrALAZINE (APRESOLINE) 25 MG tablet Take 1 tablet (25 mg total) by mouth 4 (four) times daily.   No facility-administered medications prior to visit.    Review of Systems  Respiratory:  Negative for cough, shortness of breath and wheezing.   Cardiovascular:  Negative for chest pain, palpitations and leg  swelling.    {Labs  Heme  Chem  Endocrine  Serology  Results Review (optional):23779}   Objective    BP 109/69 (BP Location: Right Arm, Patient Position: Sitting, Cuff Size: Normal)   Pulse 60   Temp 98.6 F (37 C) (Oral)   Wt 174 lb (78.9 kg)   SpO2 100%   BMI 25.70 kg/m  {Show previous vital signs (optional):23777}  Physical Exam  ***  No results found for any visits on 09/02/22.  Assessment & Plan     ***  No follow-ups on file.      {provider attestation***:1}   [name], PA-C  [facility] [phone] (phone) [phone] (fax)  [facility]

## 2022-09-02 ENCOUNTER — Encounter: Payer: Self-pay | Admitting: Physician Assistant

## 2022-09-02 ENCOUNTER — Ambulatory Visit (INDEPENDENT_AMBULATORY_CARE_PROVIDER_SITE_OTHER): Payer: 59 | Admitting: Physician Assistant

## 2022-09-02 ENCOUNTER — Other Ambulatory Visit: Payer: Self-pay

## 2022-09-02 ENCOUNTER — Other Ambulatory Visit: Payer: Self-pay | Admitting: Physician Assistant

## 2022-09-02 VITALS — BP 109/69 | HR 60 | Temp 98.6°F | Wt 174.0 lb

## 2022-09-02 DIAGNOSIS — J45909 Unspecified asthma, uncomplicated: Secondary | ICD-10-CM

## 2022-09-02 DIAGNOSIS — Z1159 Encounter for screening for other viral diseases: Secondary | ICD-10-CM

## 2022-09-02 DIAGNOSIS — I10 Essential (primary) hypertension: Secondary | ICD-10-CM

## 2022-09-02 DIAGNOSIS — Z1211 Encounter for screening for malignant neoplasm of colon: Secondary | ICD-10-CM | POA: Diagnosis not present

## 2022-09-02 DIAGNOSIS — J45901 Unspecified asthma with (acute) exacerbation: Secondary | ICD-10-CM

## 2022-09-02 DIAGNOSIS — Z136 Encounter for screening for cardiovascular disorders: Secondary | ICD-10-CM

## 2022-09-02 MED ORDER — ALBUTEROL SULFATE (2.5 MG/3ML) 0.083% IN NEBU
2.5000 mg | INHALATION_SOLUTION | Freq: Four times a day (QID) | RESPIRATORY_TRACT | 0 refills | Status: DC | PRN
Start: 2022-09-02 — End: 2022-09-02
  Filled 2022-09-02: qty 75, 7d supply, fill #0

## 2022-09-02 MED ORDER — ALBUTEROL SULFATE HFA 108 (90 BASE) MCG/ACT IN AERS
2.0000 | INHALATION_SPRAY | Freq: Four times a day (QID) | RESPIRATORY_TRACT | 0 refills | Status: DC | PRN
Start: 2022-09-02 — End: 2022-09-28

## 2022-09-02 MED ORDER — ALBUTEROL SULFATE HFA 108 (90 BASE) MCG/ACT IN AERS
2.0000 | INHALATION_SPRAY | Freq: Four times a day (QID) | RESPIRATORY_TRACT | 0 refills | Status: DC | PRN
Start: 2022-09-02 — End: 2022-09-02
  Filled 2022-09-02: qty 6.7, 25d supply, fill #0

## 2022-09-02 MED ORDER — ALBUTEROL SULFATE (2.5 MG/3ML) 0.083% IN NEBU
2.5000 mg | INHALATION_SOLUTION | Freq: Four times a day (QID) | RESPIRATORY_TRACT | 0 refills | Status: DC | PRN
Start: 2022-09-02 — End: 2022-09-20

## 2022-09-02 MED ORDER — ALBUTEROL SULFATE (2.5 MG/3ML) 0.083% IN NEBU
2.5000 mg | INHALATION_SOLUTION | Freq: Four times a day (QID) | RESPIRATORY_TRACT | 0 refills | Status: DC | PRN
Start: 2022-09-02 — End: 2022-09-02

## 2022-09-02 NOTE — Assessment & Plan Note (Signed)
Chronic and stable Uses albuterol inhaler up to 1 -2 days Uses nebulized up 2 times a day Now working

## 2022-09-02 NOTE — Assessment & Plan Note (Signed)
Chronic and stable BP at home WNL Continue current regimen Takes hydralazine BID instead of QID Encouraged to decrease salt intake Contineu to drink a lot water

## 2022-09-07 IMAGING — CT CT CERVICAL SPINE W/O CM
3 of 4 series · 12 of 33 positions shown, 14 images · non-contrast
Comparison: 02/17/2019

CLINICAL DATA: Intoxication, altered mental status

EXAM:
CT HEAD WITHOUT CONTRAST
CT CERVICAL SPINE WITHOUT CONTRAST
TECHNIQUE: Multidetector CT imaging of the head and cervical spine was
performed following the standard protocol without intravenous
contrast. Multiplanar CT image reconstructions of the cervical spine
were also generated.

[Series 6: sagittal bone · sagittal · 0.25mm/px · 5 of 57 slices shown, 6 images]
[im 19/57  bone]
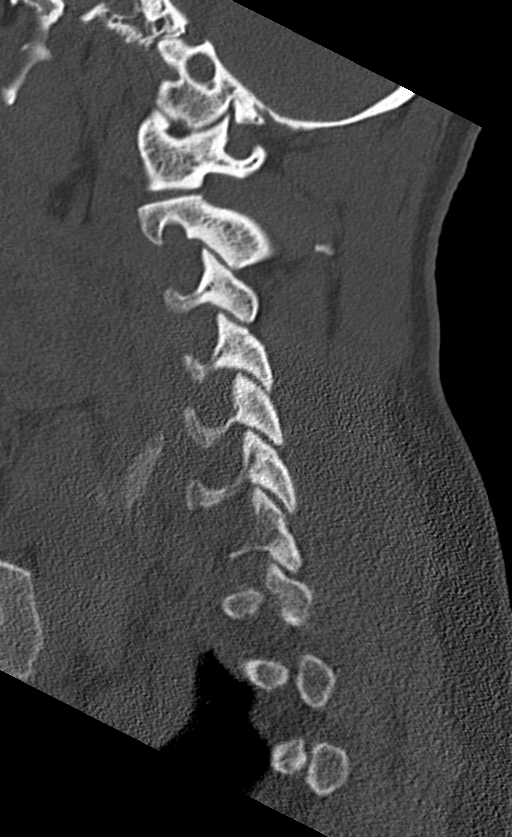
[im 24/57  bone]
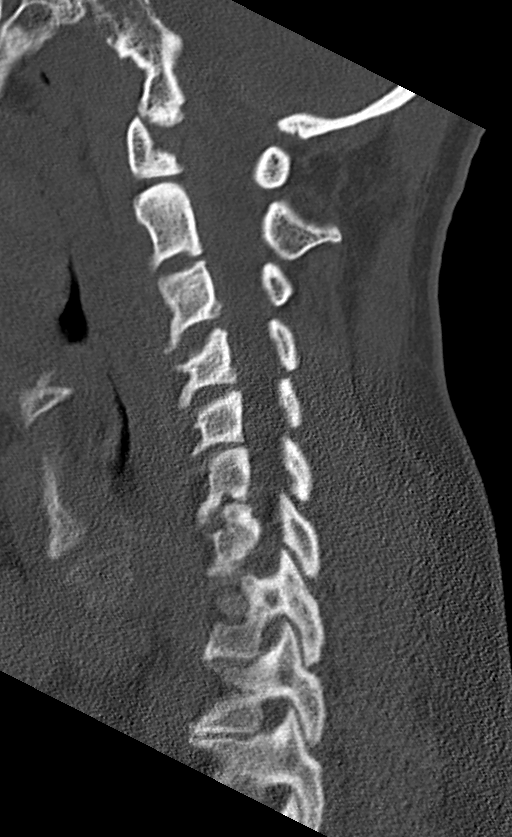
[im 29/57  soft-tissue]
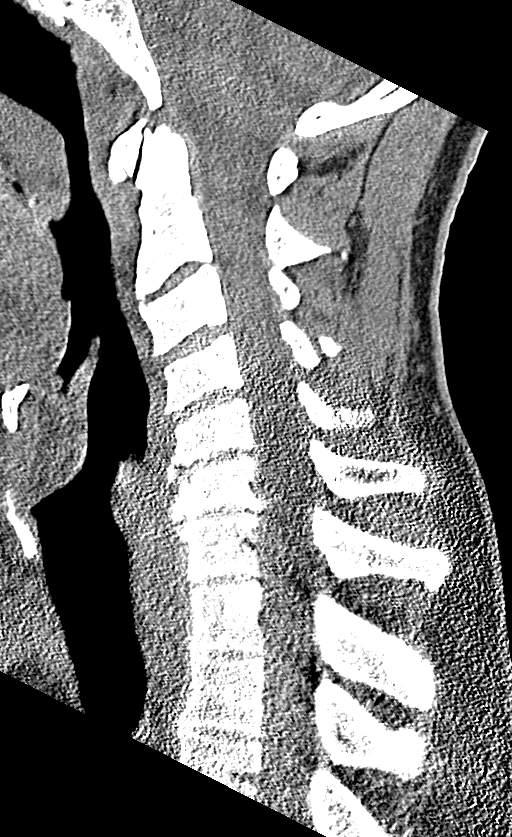
[im 29/57  bone]
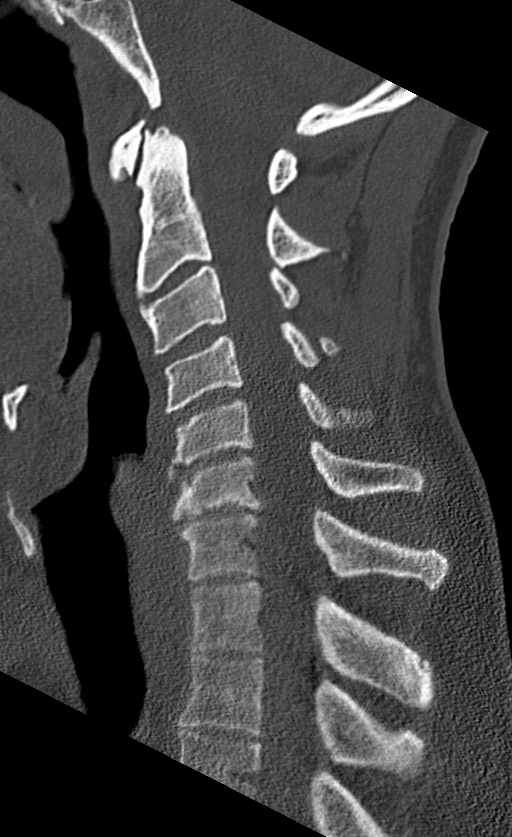
[im 33/57  bone]
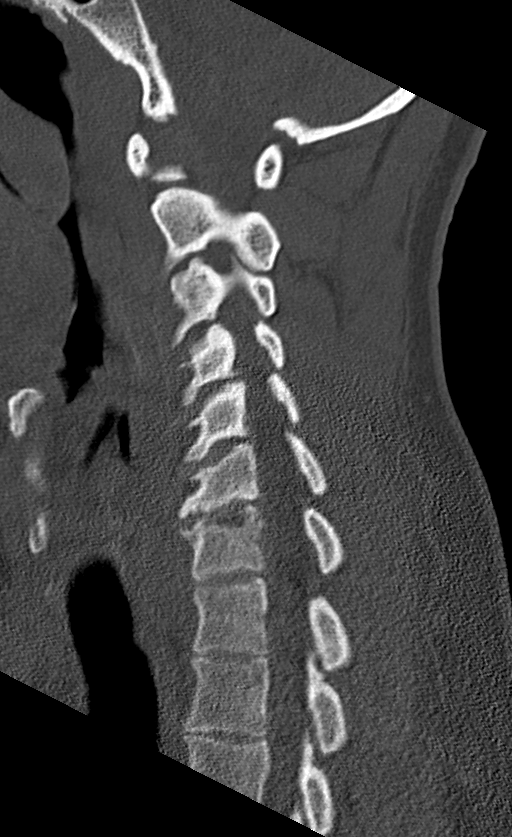
[im 38/57  bone]
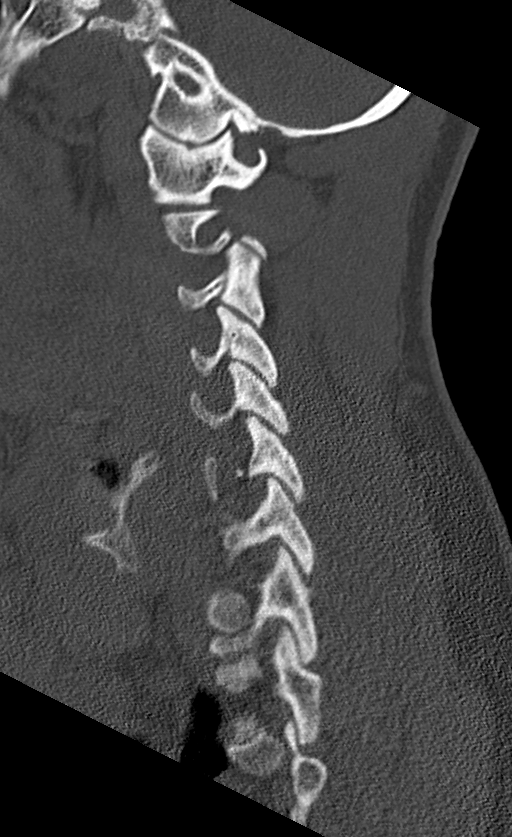

[Series 7: coronal bone · coronal · 0.26mm/px · 3 of 61 slices shown]
[im 16/61  bone]
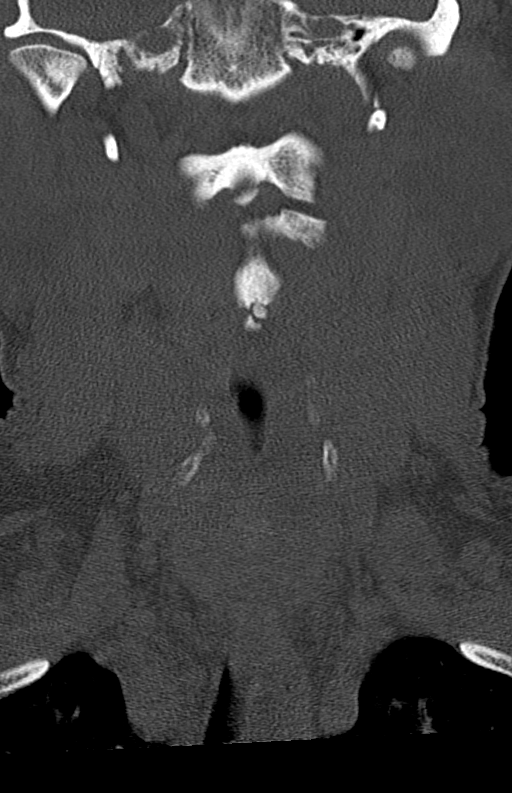
[im 26/61  bone]
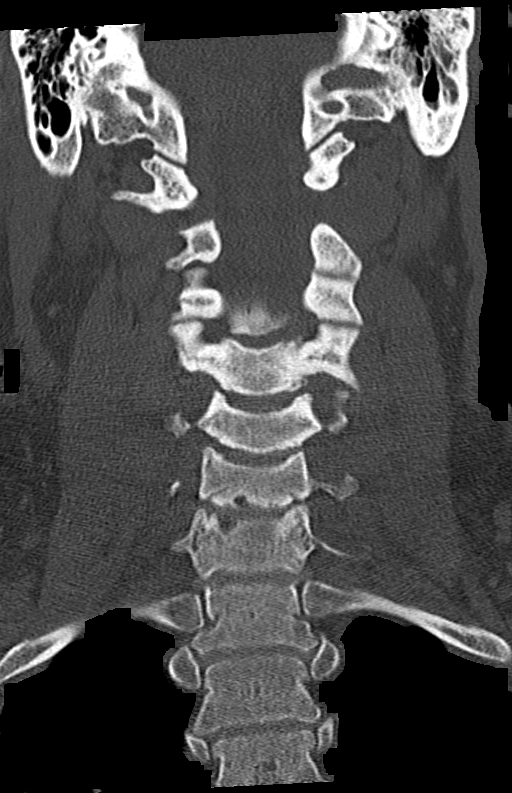
[im 36/61  bone]
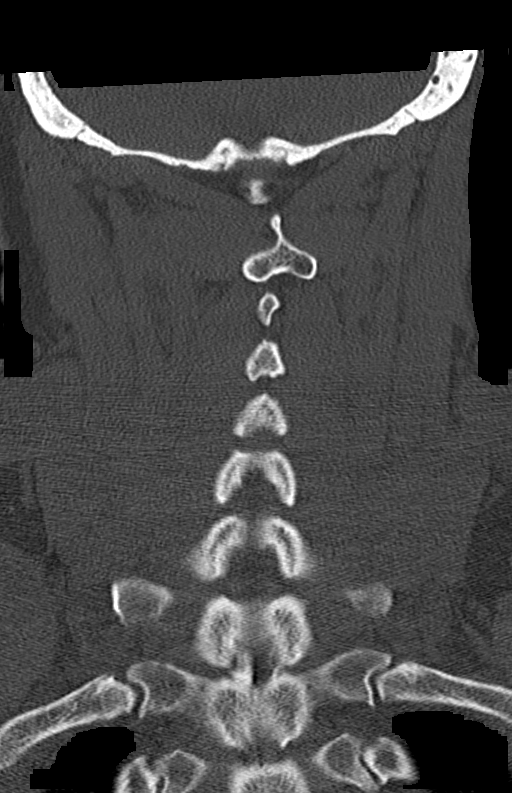

[Series 8: orthogonal bone · axial · 0.24mm/px · z∈[+1078,+1214]mm · 4 of 109 slices shown, 5 images]
[im 16/109  soft-tissue]
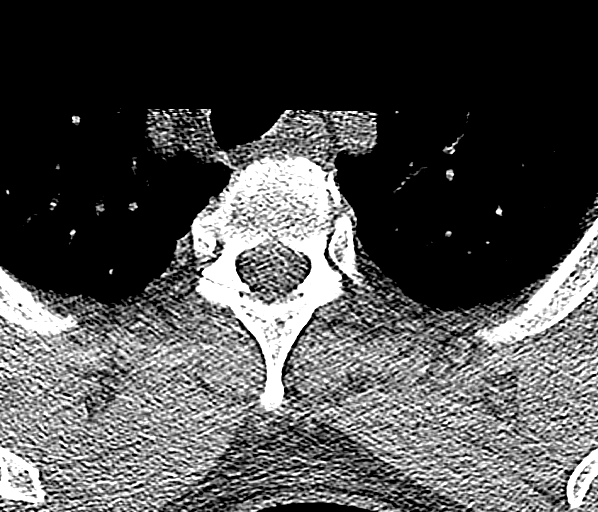
[im 16/109  bone]
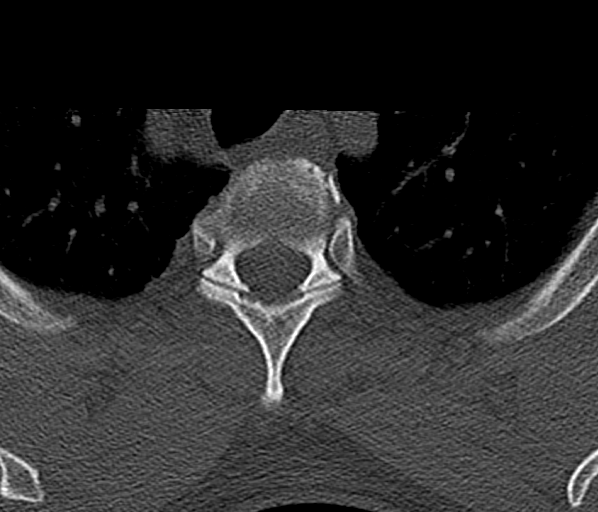
[im 47/109  bone]
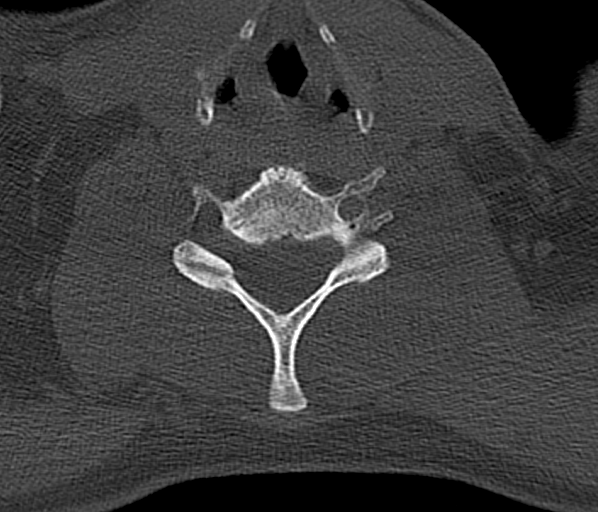
[im 62/109  bone]
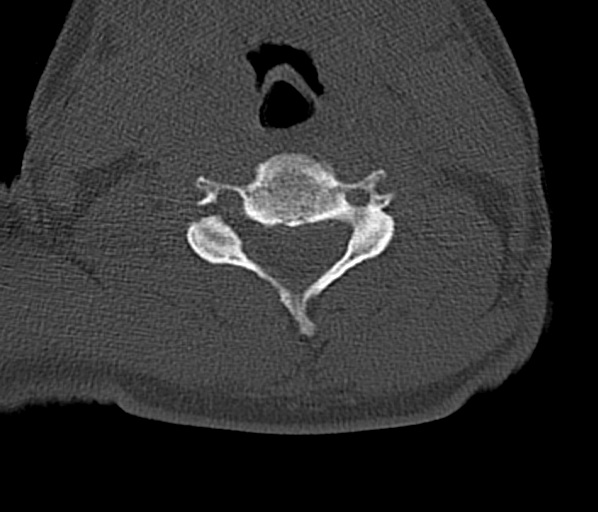
[im 93/109  bone]
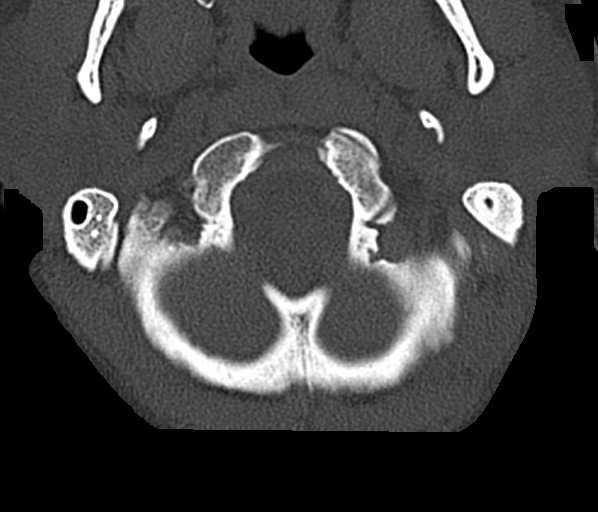

[12 of 33 positions shown; findings below may reference images not displayed]

FINDINGS: CT HEAD FINDINGS

Brain: No evidence of acute infarction, hemorrhage, hydrocephalus,
extra-axial collection or mass lesion/mass effect.

Vascular: No hyperdense vessel or unexpected calcification.

Skull: Normal. Negative for fracture or focal lesion.

Sinuses/Orbits: No acute finding.

Other: None.

CT CERVICAL SPINE FINDINGS

Alignment: Normal.

Skull base and vertebrae: No acute fracture. No primary bone lesion
or focal pathologic process.

Soft tissues and spinal canal: No prevertebral fluid or swelling. No
visible canal hematoma.

Disc levels: Focally moderate disc space height loss and
osteophytosis C6-C7.

Upper chest: Negative.

Other: None.
IMPRESSION: 1. No acute intracranial pathology.
2. No fracture or static subluxation of the cervical spine.

## 2022-09-08 ENCOUNTER — Telehealth: Payer: Self-pay

## 2022-09-08 NOTE — Telephone Encounter (Signed)
Message left for patient to return my call.  

## 2022-09-08 NOTE — Telephone Encounter (Signed)
Pt LVMM to schedule colonoscopy please return call 

## 2022-09-12 ENCOUNTER — Telehealth: Payer: Self-pay

## 2022-09-12 NOTE — Telephone Encounter (Signed)
Pt returning your call to schedule his screening colonoscopy.  

## 2022-09-13 ENCOUNTER — Telehealth: Payer: Self-pay | Admitting: *Deleted

## 2022-09-13 ENCOUNTER — Other Ambulatory Visit: Payer: Self-pay | Admitting: *Deleted

## 2022-09-13 ENCOUNTER — Telehealth: Payer: Self-pay

## 2022-09-13 DIAGNOSIS — Z1211 Encounter for screening for malignant neoplasm of colon: Secondary | ICD-10-CM

## 2022-09-13 MED ORDER — NA SULFATE-K SULFATE-MG SULF 17.5-3.13-1.6 GM/177ML PO SOLN: 1.0000 | Freq: Once | ORAL | 0 refills | Status: AC

## 2022-09-13 NOTE — Telephone Encounter (Signed)
Gastroenterology Pre-Procedure Review  Request Date: 11/01/2022 Requesting Physician: Dr. Servando Snare  PATIENT REVIEW QUESTIONS: The patient responded to the following health history questions as indicated:    1. Are you having any GI issues? no 2. Do you have a personal history of Polyps? no 3. Do you have a family history of Colon Cancer or Polyps? no 4. Diabetes Mellitus? no 5. Joint replacements in the past 12 months?no 6. Major health problems in the past 3 months?no 7. Any artificial heart valves, MVP, or defibrillator?no    MEDICATIONS & ALLERGIES:    Patient reports the following regarding taking any anticoagulation/antiplatelet therapy:   Plavix, Coumadin, Eliquis, Xarelto, Lovenox, Pradaxa, Brilinta, or Effient? no Aspirin? no  Patient confirms/reports the following medications:  Current Outpatient Medications  Medication Sig Dispense Refill   albuterol (PROVENTIL) (2.5 MG/3ML) 0.083% nebulizer solution Take 3 mLs (2.5 mg total) by nebulization every 6 (six) hours as needed. 75 mL 0   albuterol (VENTOLIN HFA) 108 (90 Base) MCG/ACT inhaler Inhale 2 puffs into the lungs every 6 (six) hours as needed for wheezing or shortness of breath 6.7 g 0   carvedilol (COREG) 25 MG tablet Take 1 tablet (25 mg total) by mouth 2 (two) times daily. 60 tablet 1   hydrALAZINE (APRESOLINE) 25 MG tablet Take 1 tablet (25 mg total) by mouth 4 (four) times daily. 120 tablet 11   No current facility-administered medications for this visit.    Patient confirms/reports the following allergies:  No Known Allergies  No orders of the defined types were placed in this encounter.   AUTHORIZATION INFORMATION Primary Insurance: 1D#: Group #:  Secondary Insurance: 1D#: Group #:  SCHEDULE INFORMATION: Date: 11/01/2022 Time: Location:  [facility]

## 2022-09-13 NOTE — Telephone Encounter (Signed)
Pt left message to schedule colonoscopy please return call  

## 2022-09-13 NOTE — Telephone Encounter (Signed)
Message left for patient to return my call.  

## 2022-09-13 NOTE — Telephone Encounter (Signed)
Colonoscopy schedule on 11/01/2022 with Dr Servando Snare at [facility]

## 2022-09-19 ENCOUNTER — Other Ambulatory Visit: Payer: Self-pay | Admitting: Physician Assistant

## 2022-09-19 DIAGNOSIS — J45909 Unspecified asthma, uncomplicated: Secondary | ICD-10-CM

## 2022-09-22 ENCOUNTER — Other Ambulatory Visit: Payer: Self-pay | Admitting: Physician Assistant

## 2022-09-22 DIAGNOSIS — J45909 Unspecified asthma, uncomplicated: Secondary | ICD-10-CM

## 2022-09-28 ENCOUNTER — Other Ambulatory Visit: Payer: Self-pay | Admitting: Physician Assistant

## 2022-09-28 DIAGNOSIS — J45909 Unspecified asthma, uncomplicated: Secondary | ICD-10-CM

## 2022-09-28 MED ORDER — ALBUTEROL SULFATE HFA 108 (90 BASE) MCG/ACT IN AERS: 2.0000 | INHALATION_SPRAY | Freq: Four times a day (QID) | RESPIRATORY_TRACT | 1 refills | Status: DC | PRN

## 2022-09-28 NOTE — Addendum Note (Signed)
Addended by: Nena Polio on: 09/28/2022 10:07 AM   Modules accepted: Orders

## 2022-09-28 NOTE — Telephone Encounter (Addendum)
Medication Refill - Medication: albuterol (VENTOLIN HFA) 108 (90 Base) MCG/ACT inhaler [   Has the patient contacted their pharmacy? Yes.        Preferred Pharmacy (with phone number or street name):  [facility] [address]), [place] - [address]  [address]) [place] [address]  Phone: [phone] Fax: [phone]  Hours: Not open 24 hours      Has the patient been seen for an appointment in the last year OR does the patient have an upcoming appointment? Yes.    Agent: Please be advised that RX refills may take up to 3 business days. We ask that you follow-up with your pharmacy.

## 2022-09-28 NOTE — Telephone Encounter (Signed)
Requested Prescriptions  Pending Prescriptions Disp Refills   albuterol (VENTOLIN HFA) 108 (90 Base) MCG/ACT inhaler 8 g 1    Sig: Inhale 2 puffs into the lungs every 6 (six) hours as needed for wheezing or shortness of breath     Pulmonology:  Beta Agonists 2 Passed - 09/28/2022 10:07 AM      Passed - Last BP in normal range    BP Readings from Last 1 Encounters:  09/02/22 109/69         Passed - Last Heart Rate in normal range    Pulse Readings from Last 1 Encounters:  09/02/22 60         Passed - Valid encounter within last 12 months    Recent Outpatient Visits           3 weeks ago Essential hypertension   [facility] [place], [place], PA-C   6 months ago Essential hypertension   [facility] [facility] [place], [place], PA-C   6 months ago Essential hypertension   [place] [facility] [place], [place], [place]

## 2022-10-11 ENCOUNTER — Other Ambulatory Visit: Payer: Self-pay | Admitting: Physician Assistant

## 2022-10-11 DIAGNOSIS — J45909 Unspecified asthma, uncomplicated: Secondary | ICD-10-CM

## 2022-10-12 NOTE — Telephone Encounter (Signed)
Unable to refill per protocol, Rx request is too soon. Duplicate request.  Requested Prescriptions  Pending Prescriptions Disp Refills   albuterol (PROVENTIL) (2.5 MG/3ML) 0.083% nebulizer solution [Pharmacy Med Name: Albuterol Sulfate (2.5 MG/3ML) 0.083% Inhalation Nebulization Solution] 90 mL 0    Sig: USE 1 VIAL IN NEBULIZER EVERY 6 HOURS AS NEEDED FOR WHEEZING FOR SHORTNESS OF BREATH     Pulmonology:  Beta Agonists 2 Passed - 10/11/2022  9:38 AM      Passed - Last BP in normal range    BP Readings from Last 1 Encounters:  09/02/22 109/69         Passed - Last Heart Rate in normal range    Pulse Readings from Last 1 Encounters:  09/02/22 60         Passed - Valid encounter within last 12 months    Recent Outpatient Visits           1 month ago Essential hypertension   [facility] [place], [place], PA-C   6 months ago Essential hypertension   [facility] [place], [place], PA-C   6 months ago Essential hypertension   [place] [facility] [place], [place], [place]

## 2022-10-25 ENCOUNTER — Encounter: Payer: Self-pay | Admitting: Gastroenterology

## 2022-10-31 ENCOUNTER — Other Ambulatory Visit: Payer: Self-pay | Admitting: Physician Assistant

## 2022-10-31 DIAGNOSIS — J45909 Unspecified asthma, uncomplicated: Secondary | ICD-10-CM

## 2022-11-01 ENCOUNTER — Encounter: Payer: Self-pay | Admitting: Gastroenterology

## 2022-11-01 ENCOUNTER — Ambulatory Visit
Admission: RE | Admit: 2022-11-01 | Discharge: 2022-11-01 | Disposition: A | Discharge status: Home / Self Care | Payer: 59 | Attending: Gastroenterology | Admitting: Gastroenterology

## 2022-11-01 ENCOUNTER — Ambulatory Visit: Payer: 59 | Admitting: Anesthesiology

## 2022-11-01 ENCOUNTER — Telehealth: Payer: Self-pay | Admitting: Physician Assistant

## 2022-11-01 ENCOUNTER — Encounter
Admission: RE | Disposition: A | Discharge status: Home / Self Care | Payer: Self-pay | Source: Home / Self Care | Attending: Gastroenterology

## 2022-11-01 DIAGNOSIS — I10 Essential (primary) hypertension: Secondary | ICD-10-CM | POA: Diagnosis not present

## 2022-11-01 DIAGNOSIS — J45909 Unspecified asthma, uncomplicated: Secondary | ICD-10-CM | POA: Diagnosis not present

## 2022-11-01 DIAGNOSIS — Z1211 Encounter for screening for malignant neoplasm of colon: Secondary | ICD-10-CM | POA: Insufficient documentation

## 2022-11-01 DIAGNOSIS — K5730 Diverticulosis of large intestine without perforation or abscess without bleeding: Secondary | ICD-10-CM | POA: Insufficient documentation

## 2022-11-01 DIAGNOSIS — Z79899 Other long term (current) drug therapy: Secondary | ICD-10-CM | POA: Insufficient documentation

## 2022-11-01 DIAGNOSIS — K635 Polyp of colon: Secondary | ICD-10-CM | POA: Insufficient documentation

## 2022-11-01 HISTORY — PX: COLONOSCOPY WITH PROPOFOL: SHX5780

## 2022-11-01 HISTORY — PX: POLYPECTOMY: SHX5525

## 2022-11-01 SURGERY — COLONOSCOPY WITH PROPOFOL
Anesthesia: General

## 2022-11-01 MED ORDER — PROPOFOL 10 MG/ML IV BOLUS
INTRAVENOUS | Status: DC | PRN
  Administered 2022-11-01: 100 mg via INTRAVENOUS

## 2022-11-01 MED ORDER — PROPOFOL 500 MG/50ML IV EMUL
INTRAVENOUS | Status: DC | PRN
  Administered 2022-11-01: 140 ug/kg/min via INTRAVENOUS

## 2022-11-01 MED ORDER — SODIUM CHLORIDE 0.9 % IV SOLN: INTRAVENOUS | Status: DC

## 2022-11-01 MED ORDER — PROPOFOL 1000 MG/100ML IV EMUL
INTRAVENOUS | Status: AC
  Filled 2022-11-01: qty 100

## 2022-11-01 NOTE — Anesthesia Postprocedure Evaluation (Signed)
Anesthesia Post Note  Patient: Geophysical data processor  Procedure(s) Performed: COLONOSCOPY WITH PROPOFOL POLYPECTOMY  Patient location during evaluation: Endoscopy Anesthesia Type: General Level of consciousness: awake and alert Pain management: pain level controlled Vital Signs Assessment: post-procedure vital signs reviewed and stable Respiratory status: spontaneous breathing, nonlabored ventilation and respiratory function stable Cardiovascular status: blood pressure returned to baseline and stable Postop Assessment: no apparent nausea or vomiting Anesthetic complications: no   No notable events documented.   Last Vitals:  Vitals:   11/01/22 0905 11/01/22 0915  BP: 113/81 (!) 124/93  Pulse:    Resp:    Temp:    SpO2:      Last Pain:  Vitals:   11/01/22 0915  TempSrc:   PainSc: 0-No pain                 Foye Deer

## 2022-11-01 NOTE — Op Note (Signed)
[facility] Gastroenterology Patient Name: Ruben Gonzalez Procedure Date: 11/01/2022 8:25 AM MRN: [id number] Account #: [id number] Date of Birth: [birth year] Admit Type: Outpatient Age: 52 Room: [facility] ENDO ROOM 4 Gender: Male Note Status: Finalized Instrument Name: Prentice Docker [id number] Procedure:             Colonoscopy Indications:           Screening for colorectal malignant neoplasm Providers:             Midge Minium MD, MD Medicines:             Propofol per Anesthesia Complications:         No immediate complications. Procedure:             Pre-Anesthesia Assessment:                        - Prior to the procedure, a History and Physical was                         performed, and patient medications and allergies were                         reviewed. The patient's tolerance of previous                         anesthesia was also reviewed. The risks and benefits                         of the procedure and the sedation options and risks                         were discussed with the patient. All questions were                         answered, and informed consent was obtained. Prior                         Anticoagulants: The patient has taken no anticoagulant                         or antiplatelet agents. ASA Grade Assessment: II - A                         patient with mild systemic disease. After reviewing                         the risks and benefits, the patient was deemed in                         satisfactory condition to undergo the procedure.                        After obtaining informed consent, the colonoscope was                         passed under direct vision. Throughout the procedure,                         the patient's blood  pressure, pulse, and oxygen                         saturations were monitored continuously. The                         Colonoscope was introduced through the anus and                         advanced to the the  cecum, identified by appendiceal                         orifice and ileocecal valve. The colonoscopy was                         performed without difficulty. The patient tolerated                         the procedure well. The quality of the bowel                         preparation was excellent. Findings:      The perianal and digital rectal examinations were normal.      A 5 mm polyp was found in the sigmoid colon. The polyp was sessile. The       polyp was removed with a cold snare. Resection and retrieval were       complete.      Multiple small-mouthed diverticula were found in the entire colon. Impression:            - One 5 mm polyp in the sigmoid colon, removed with a                         cold snare. Resected and retrieved.                        - Diverticulosis in the entire examined colon. Recommendation:        - Discharge patient to home.                        - Resume previous diet.                        - Continue present medications.                        - Await pathology results.                        - If the pathology report reveals adenomatous tissue,                         then repeat the colonoscopy for surveillance in 7                         years otherwise 10 years. Procedure Code(s):     --- Professional ---                        [phone], Colonoscopy, flexible; with removal of  tumor(s), polyp(s), or other lesion(s) by snare                         technique Diagnosis Code(s):     --- Professional ---                        Z12.11, Encounter for screening for malignant neoplasm                         of colon                        D12.5, Benign neoplasm of sigmoid colon CPT copyright 2022 American Medical Association. All rights reserved. The codes documented in this report are preliminary and upon coder review may  be revised to meet current compliance requirements. Midge Minium MD, MD 11/01/2022 8:53:29 AM This report  has been signed electronically. Number of Addenda: 0 Note Initiated On: 11/01/2022 8:25 AM Scope Withdrawal Time: 0 hours 7 minutes 38 seconds  Total Procedure Duration: 0 hours 11 minutes 15 seconds  Estimated Blood Loss:  Estimated blood loss: none.      [facility]

## 2022-11-01 NOTE — H&P (Signed)
Midge Minium, MD [facility] [address] [place], [place] [address] Phone: [phone] Fax : [phone]  Primary Care Physician:  [name], PA-C Primary Gastroenterologist:  Dr. Servando Snare  Pre-Procedure History & Physical: HPI:  Ruben Gonzalez is a 52 y.o. male is here for a screening colonoscopy.   Past Medical History:  Diagnosis Date   Allergies    Asthma    Hypertension     Past Surgical History:  Procedure Laterality Date   TONSILLECTOMY     VASECTOMY      Prior to Admission medications   Medication Sig Start Date End Date Taking? Authorizing Provider  albuterol (PROVENTIL) (2.5 MG/3ML) 0.083% nebulizer solution Take 3 mLs (2.5 mg total) by nebulization every 6 (six) hours as needed for wheezing or shortness of breath. 09/20/22  Yes Ostwalt, Janna, PA-C  carvedilol (COREG) 25 MG tablet Take 1 tablet (25 mg total) by mouth 2 (two) times daily. 08/10/22  Yes Willeen Niece, MD  albuterol (VENTOLIN HFA) 108 (90 Base) MCG/ACT inhaler Inhale 2 puffs into the lungs every 6 (six) hours as needed for wheezing or shortness of breath 09/28/22   Ostwalt, Edmon Crape, PA-C  hydrALAZINE (APRESOLINE) 25 MG tablet Take 1 tablet (25 mg total) by mouth 4 (four) times daily. 08/10/22 08/10/23  Willeen Niece, MD    Allergies as of 09/13/2022   (No Known Allergies)    Family History  Problem Relation Age of Onset   Heart attack Mother    Allergies Mother     Social History   Socioeconomic History   Marital status: Single    Spouse name: Not on file   Number of children: Not on file   Years of education: Not on file   Highest education level: Not on file  Occupational History   Occupation: works for [employer]  Tobacco Use   Smoking status: Never   Smokeless tobacco: Never  Vaping Use   Vaping Use: Never used  Substance and Sexual Activity   Alcohol use: Yes    Alcohol/week: 14.0 standard drinks of alcohol    Types: 6 Cans of beer, 8 Shots of liquor per week   Drug use:  Not Currently    Types: Marijuana   Sexual activity: Yes  Other Topics Concern   Not on file  Social History Narrative   Not on file   Social Determinants of Health   Financial Resource Strain: Not on file  Food Insecurity: No Food Insecurity (08/08/2022)   Hunger Vital Sign    Worried About Running Out of Food in the Last Year: Never true    Ran Out of Food in the Last Year: Never true  Transportation Needs: No Transportation Needs (08/08/2022)   PRAPARE - Administrator, Civil Service (Medical): No    Lack of Transportation (Non-Medical): No  Physical Activity: Not on file  Stress: Not on file  Social Connections: Not on file  Intimate Partner Violence: Patient Declined (08/08/2022)   Humiliation, Afraid, Rape, and Kick questionnaire    Fear of Current or Ex-Partner: Patient declined    Emotionally Abused: Patient declined    Physically Abused: Patient declined    Sexually Abused: Patient declined    Review of Systems: See HPI, otherwise negative ROS  Physical Exam: BP (!) 156/106   Pulse 60   Temp (!) 97 F (36.1 C) (Temporal)   Resp 18   Ht 5\' 9"  (1.753 m)   Wt 79.4 kg   SpO2 100%  BMI 25.84 kg/m  General:   Alert,  pleasant and cooperative in NAD Head:  Normocephalic and atraumatic. Neck:  Supple; no masses or thyromegaly. Lungs:  Clear throughout to auscultation.    Heart:  Regular rate and rhythm. Abdomen:  Soft, nontender and nondistended. Normal bowel sounds, without guarding, and without rebound.   Neurologic:  Alert and  oriented x4;  grossly normal neurologically.  Impression/Plan: Ruben Gonzalez is now here to undergo a screening colonoscopy.  Risks, benefits, and alternatives regarding colonoscopy have been reviewed with the patient.  Questions have been answered.  All parties agreeable.

## 2022-11-01 NOTE — Anesthesia Preprocedure Evaluation (Signed)
Anesthesia Evaluation  Patient identified by MRN, date of birth, ID band Patient awake    Reviewed: Allergy & Precautions, H&P , NPO status , Patient's Chart, lab work & pertinent test results, reviewed documented beta blocker date and time   Airway Mallampati: II  TM Distance: >3 FB Neck ROM: full    Dental  (+) Loose,    Pulmonary asthma    Pulmonary exam normal        Cardiovascular hypertension, Pt. on medications and Pt. on home beta blockers Normal cardiovascular exam  Carotid artery stenosis   Neuro/Psych negative neurological ROS  negative psych ROS   GI/Hepatic negative GI ROS, Neg liver ROS,,,  Endo/Other  negative endocrine ROS    Renal/GU negative Renal ROS  negative genitourinary   Musculoskeletal   Abdominal Normal abdominal exam  (+)   Peds  Hematology negative hematology ROS (+)   Anesthesia Other Findings 08/2022-Acute respiratory failure with hypoxia due to asthma exacerbation and CAP SIRS d/t asthma exacerbation vs Sepsis d/t CAP   Past Medical History: No date: Allergies No date: Asthma No date: Hypertension  Past Surgical History: No date: TONSILLECTOMY No date: VASECTOMY  BMI    Body Mass Index: 25.84 kg/m      Reproductive/Obstetrics negative OB ROS                              Anesthesia Physical Anesthesia Plan  ASA: 2  Anesthesia Plan: General   Post-op Pain Management: Minimal or no pain anticipated   Induction: Intravenous  PONV Risk Score and Plan: Propofol infusion and TIVA  Airway Management Planned: Natural Airway  Additional Equipment:   Intra-op Plan:   Post-operative Plan:   Informed Consent: I have reviewed the patients History and Physical, chart, labs and discussed the procedure including the risks, benefits and alternatives for the proposed anesthesia with the patient or authorized representative who has indicated his/her  understanding and acceptance.     Dental Advisory Given  Plan Discussed with: CRNA and Surgeon  Anesthesia Plan Comments:          Anesthesia Quick Evaluation

## 2022-11-01 NOTE — Telephone Encounter (Signed)
Requested Prescriptions  Pending Prescriptions Disp Refills   albuterol (PROVENTIL) (2.5 MG/3ML) 0.083% nebulizer solution [Pharmacy Med Name: Albuterol Sulfate (2.5 MG/3ML) 0.083% Inhalation Nebulization Solution] 90 mL 0    Sig: USE 1 VIAL IN NEBULIZER EVERY 6 HOURS AS NEEDED FOR WHEEZING FOR SHORTNESS OF BREATH     Pulmonology:  Beta Agonists 2 Passed - 10/31/2022  4:01 PM      Passed - Last BP in normal range    BP Readings from Last 1 Encounters:  11/01/22 (!) 124/93         Passed - Last Heart Rate in normal range    Pulse Readings from Last 1 Encounters:  11/01/22 60         Passed - Valid encounter within last 12 months    Recent Outpatient Visits           2 months ago Essential hypertension   [facility] [place], [place], PA-C   7 months ago Essential hypertension   [place] [facility] [place], [place], PA-C   7 months ago Essential hypertension   [place] [facility] [place], [place], [place]

## 2022-11-01 NOTE — Telephone Encounter (Signed)
Pt called in checking status of refill of Albuterol Sulfate (2.5 MG/3ML) , I told him of 48-72 hr turn around, received yesterday.

## 2022-11-01 NOTE — Transfer of Care (Signed)
Immediate Anesthesia Transfer of Care Note  Patient: Ruben Gonzalez  Procedure(s) Performed: COLONOSCOPY WITH PROPOFOL POLYPECTOMY  Patient Location: PACU  Anesthesia Type:General  Level of Consciousness: awake and alert   Airway & Oxygen Therapy: Patient Spontanous Breathing  Post-op Assessment: Report given to RN and Post -op Vital signs reviewed and stable  Post vital signs: Reviewed and stable  Last Vitals:  Vitals Value Taken Time  BP 101/68 11/01/22 0856  Temp 36.1 C 11/01/22 0855  Pulse 79 11/01/22 0856  Resp 12 11/01/22 0856  SpO2 98 % 11/01/22 0856  Vitals shown include unvalidated device data.  Last Pain:  Vitals:   11/01/22 0855  TempSrc: Temporal  PainSc: Asleep         Complications: No notable events documented.

## 2022-11-07 ENCOUNTER — Encounter: Payer: Self-pay | Admitting: Gastroenterology

## 2022-11-22 ENCOUNTER — Telehealth: Payer: Self-pay

## 2022-11-22 NOTE — Telephone Encounter (Signed)
Received returned in the mail patient colonoscopy result letter. They were unable to deliver it to the patient

## 2022-11-25 ENCOUNTER — Other Ambulatory Visit: Payer: Self-pay | Admitting: Physician Assistant

## 2022-11-25 DIAGNOSIS — J45909 Unspecified asthma, uncomplicated: Secondary | ICD-10-CM

## 2022-11-25 NOTE — Telephone Encounter (Signed)
Requested Prescriptions  Pending Prescriptions Disp Refills   albuterol (PROVENTIL) (2.5 MG/3ML) 0.083% nebulizer solution [Pharmacy Med Name: Albuterol Sulfate (2.5 MG/3ML) 0.083% Inhalation Nebulization Solution] 90 mL 0    Sig: USE 1 VIAL IN NEBULIZER EVERY 6 HOURS AS NEEDED FOR WHEEZING FOR SHORTNESS OF BREATH     Pulmonology:  Beta Agonists 2 Failed - 11/25/2022 10:03 AM      Failed - Last BP in normal range    BP Readings from Last 1 Encounters:  11/01/22 (!) 124/93         Passed - Last Heart Rate in normal range    Pulse Readings from Last 1 Encounters:  11/01/22 60         Passed - Valid encounter within last 12 months    Recent Outpatient Visits           2 months ago Essential hypertension   [place] [facility] [place], [place], PA-C   8 months ago Essential hypertension   [place] [facility] [place], [place], PA-C   8 months ago Essential hypertension   [place] [facility], [place], PA-C               albuterol (VENTOLIN HFA) 108 (90 Base) MCG/ACT inhaler [Pharmacy Med Name: Albuterol Sulfate HFA 108 (90 Base) MCG/ACT Inhalation Aerosol Solution] 18 g 0    Sig: INHALE 2 PUFFS BY MOUTH EVERY 6 HOURS AS NEEDED FOR WHEEZING OR SHORTNESS OF BREATH     Pulmonology:  Beta Agonists 2 Failed - 11/25/2022 10:03 AM      Failed - Last BP in normal range    BP Readings from Last 1 Encounters:  11/01/22 (!) 124/93         Passed - Last Heart Rate in normal range    Pulse Readings from Last 1 Encounters:  11/01/22 60         Passed - Valid encounter within last 12 months    Recent Outpatient Visits           2 months ago Essential hypertension   [place] [facility] [place], [place], PA-C   8 months ago Essential hypertension   [place] [facility] [place], [place], PA-C   8 months ago Essential hypertension   [place] [facility] [place], [place], [place]

## 2022-12-12 ENCOUNTER — Telehealth: Payer: Self-pay | Admitting: Physician Assistant

## 2022-12-12 NOTE — Telephone Encounter (Signed)
[facility] faxed refill request for the following medications:   carvedilol (COREG) 25 MG tablet     Please advise.

## 2022-12-13 ENCOUNTER — Other Ambulatory Visit: Payer: Self-pay

## 2022-12-13 MED ORDER — CARVEDILOL 25 MG PO TABS: 25.0000 mg | ORAL_TABLET | Freq: Two times a day (BID) | ORAL | 1 refills | Status: DC

## 2022-12-14 ENCOUNTER — Telehealth: Payer: Self-pay

## 2022-12-14 NOTE — Telephone Encounter (Signed)
 Reached out to patient to set up follow up visit with provider to discuss chronic conditions.  Telephone encounter attempt : 1   No answer. Elijio Miles [facility] Specialist

## 2022-12-19 ENCOUNTER — Other Ambulatory Visit: Payer: Self-pay | Admitting: Physician Assistant

## 2022-12-19 DIAGNOSIS — J45909 Unspecified asthma, uncomplicated: Secondary | ICD-10-CM

## 2022-12-20 NOTE — Telephone Encounter (Signed)
 Reached out to patient to set up follow up visit with provider to discuss chronic conditions.  Telephone encounter attempt : 2   No answer  Ruben Gonzalez [facility] Specialist

## 2022-12-23 ENCOUNTER — Other Ambulatory Visit: Payer: Self-pay | Admitting: Physician Assistant

## 2022-12-23 DIAGNOSIS — J45909 Unspecified asthma, uncomplicated: Secondary | ICD-10-CM

## 2022-12-26 ENCOUNTER — Other Ambulatory Visit: Payer: Self-pay | Admitting: Physician Assistant

## 2022-12-26 DIAGNOSIS — J45909 Unspecified asthma, uncomplicated: Secondary | ICD-10-CM

## 2022-12-26 NOTE — Telephone Encounter (Signed)
Refilled 12/19/22. Requested Prescriptions  Refused Prescriptions Disp Refills   albuterol (PROVENTIL) (2.5 MG/3ML) 0.083% nebulizer solution [Pharmacy Med Name: Albuterol Sulfate (2.5 MG/3ML) 0.083% Inhalation Nebulization Solution] 90 mL 0    Sig: USE 1 VIAL IN NEBULIZER EVERY 6 HOURS AS NEEDED FOR WHEEZING FOR SHORTNESS OF BREATH     Pulmonology:  Beta Agonists 2 Failed - 12/23/2022  4:49 PM      Failed - Last BP in normal range    BP Readings from Last 1 Encounters:  11/01/22 (!) 124/93         Passed - Last Heart Rate in normal range    Pulse Readings from Last 1 Encounters:  11/01/22 60         Passed - Valid encounter within last 12 months    Recent Outpatient Visits           3 months ago Essential hypertension   [place] [facility] [place], [place], PA-C   9 months ago Essential hypertension   [place] [facility] [place], [place], [place]   9 months ago Essential hypertension    [facility] [place], [place], [place]

## 2023-01-12 ENCOUNTER — Other Ambulatory Visit: Payer: Self-pay | Admitting: Physician Assistant

## 2023-01-12 DIAGNOSIS — J45909 Unspecified asthma, uncomplicated: Secondary | ICD-10-CM

## 2023-01-13 NOTE — Telephone Encounter (Signed)
Requested Prescriptions  Pending Prescriptions Disp Refills   albuterol (VENTOLIN HFA) 108 (90 Base) MCG/ACT inhaler [Pharmacy Med Name: Albuterol Sulfate HFA 108 (90 Base) MCG/ACT Inhalation Aerosol Solution] 18 g 0    Sig: INHALE 2 PUFFS BY MOUTH EVERY 6 HOURS AS NEEDED FOR WHEEZING OR SHORTNESS OF BREATH     Pulmonology:  Beta Agonists 2 Failed - 01/12/2023  2:02 PM      Failed - Last BP in normal range    BP Readings from Last 1 Encounters:  11/01/22 (!) 124/93         Passed - Last Heart Rate in normal range    Pulse Readings from Last 1 Encounters:  11/01/22 60         Passed - Valid encounter within last 12 months    Recent Outpatient Visits           4 months ago Essential hypertension   [place] [facility] [place], [place], PA-C   9 months ago Essential hypertension   [place] [facility] [place], [place], PA-C   10 months ago Essential hypertension   [place] [facility] [place], [place], PA-C               albuterol (PROVENTIL) (2.5 MG/3ML) 0.083% nebulizer solution [Pharmacy Med Name: Albuterol Sulfate (2.5 MG/3ML) 0.083% Inhalation Nebulization Solution] 90 mL 0    Sig: USE 1 VIAL IN NEBULIZER EVERY 6 HOURS AS NEEDED FOR WHEEZING FOR SHORTNESS OF BREATH     Pulmonology:  Beta Agonists 2 Failed - 01/12/2023  2:02 PM      Failed - Last BP in normal range    BP Readings from Last 1 Encounters:  11/01/22 (!) 124/93         Passed - Last Heart Rate in normal range    Pulse Readings from Last 1 Encounters:  11/01/22 60         Passed - Valid encounter within last 12 months    Recent Outpatient Visits           4 months ago Essential hypertension   [place] [facility] [place], [place], PA-C   9 months ago Essential hypertension   [place] [facility] [place], [place], PA-C   10 months ago Essential hypertension   [place] [facility] [place], [place], [place]

## 2023-02-04 ENCOUNTER — Other Ambulatory Visit: Payer: Self-pay | Admitting: Physician Assistant

## 2023-02-04 DIAGNOSIS — J45909 Unspecified asthma, uncomplicated: Secondary | ICD-10-CM

## 2023-02-06 NOTE — Telephone Encounter (Signed)
Requested Prescriptions  Pending Prescriptions Disp Refills   albuterol (VENTOLIN HFA) 108 (90 Base) MCG/ACT inhaler [Pharmacy Med Name: Albuterol Sulfate HFA 108 (90 Base) MCG/ACT Inhalation Aerosol Solution] 18 g 1    Sig: INHALE 2 PUFFS BY MOUTH EVERY 6 HOURS AS NEEDED FOR WHEEZING FOR SHORTNESS OF BREATH     Pulmonology:  Beta Agonists 2 Failed - 02/04/2023  9:18 AM      Failed - Last BP in normal range    BP Readings from Last 1 Encounters:  11/01/22 (!) 124/93         Passed - Last Heart Rate in normal range    Pulse Readings from Last 1 Encounters:  11/01/22 60         Passed - Valid encounter within last 12 months    Recent Outpatient Visits           5 months ago Essential hypertension   [place] [facility] [place], [place], PA-C   10 months ago Essential hypertension   [place] [facility] [place], [place], PA-C   10 months ago Essential hypertension   [place] [facility] [place], [place], [place]

## 2023-02-08 ENCOUNTER — Other Ambulatory Visit: Payer: Self-pay | Admitting: Physician Assistant

## 2023-02-08 DIAGNOSIS — J45909 Unspecified asthma, uncomplicated: Secondary | ICD-10-CM

## 2023-02-08 NOTE — Telephone Encounter (Signed)
Requested Prescriptions  Pending Prescriptions Disp Refills   albuterol (PROVENTIL) (2.5 MG/3ML) 0.083% nebulizer solution [Pharmacy Med Name: Albuterol Sulfate (2.5 MG/3ML) 0.083% Inhalation Nebulization Solution] 90 mL 0    Sig: USE 1 VIAL IN NEBULIZER EVERY 6 HOURS AS NEEDED FOR WHEEZING FOR SHORTNESS OF BREATH     Pulmonology:  Beta Agonists 2 Failed - 02/08/2023 10:02 AM      Failed - Last BP in normal range    BP Readings from Last 1 Encounters:  11/01/22 (!) 124/93         Passed - Last Heart Rate in normal range    Pulse Readings from Last 1 Encounters:  11/01/22 60         Passed - Valid encounter within last 12 months    Recent Outpatient Visits           5 months ago Essential hypertension   [place] [facility] [place], [place], PA-C   10 months ago Essential hypertension   [place] [facility] [place], [place], PA-C   10 months ago Essential hypertension   [place] [facility] [place], [place], [place]

## 2023-02-22 ENCOUNTER — Other Ambulatory Visit: Payer: Self-pay | Admitting: Physician Assistant

## 2023-02-22 DIAGNOSIS — J45909 Unspecified asthma, uncomplicated: Secondary | ICD-10-CM

## 2023-03-01 ENCOUNTER — Other Ambulatory Visit: Payer: Self-pay | Admitting: Physician Assistant

## 2023-03-01 DIAGNOSIS — J45909 Unspecified asthma, uncomplicated: Secondary | ICD-10-CM

## 2023-03-03 ENCOUNTER — Other Ambulatory Visit: Payer: Self-pay | Admitting: Physician Assistant

## 2023-03-03 DIAGNOSIS — J45909 Unspecified asthma, uncomplicated: Secondary | ICD-10-CM

## 2023-03-03 NOTE — Telephone Encounter (Signed)
Medication Refill - Medication: albuterol (PROVENTIL) (2.5 MG/3ML) 0.083% nebulizer solution [[id number]]   Has the patient contacted their pharmacy? Yes.   (Agent: If no, request that the patient contact the pharmacy for the refill. If patient does not wish to contact the pharmacy document the reason why and proceed with request.) (Agent: If yes, when and what did the pharmacy advise?)  Preferred Pharmacy (with phone number or street name): [facility] [address] ([address]), [place] - [address] [place]) [place] [address] Phone: [phone]  Fax: [phone]  Has the patient been seen for an appointment in the last year OR does the patient have an upcoming appointment? Yes.    Agent: Please be advised that RX refills may take up to 3 business days. We ask that you follow-up with your pharmacy.

## 2023-03-03 NOTE — Telephone Encounter (Signed)
Request is too soon for refill, duplicate request.  Requested Prescriptions  Pending Prescriptions Disp Refills   albuterol (PROVENTIL) (2.5 MG/3ML) 0.083% nebulizer solution 90 mL 0     Pulmonology:  Beta Agonists 2 Failed - 03/03/2023  2:26 PM      Failed - Last BP in normal range    BP Readings from Last 1 Encounters:  11/01/22 (!) 124/93         Passed - Last Heart Rate in normal range    Pulse Readings from Last 1 Encounters:  11/01/22 60         Passed - Valid encounter within last 12 months    Recent Outpatient Visits           6 months ago Essential hypertension   [place] [facility] [place], [place], [place]   11 months ago Essential hypertension   [place] [facility] [place], [place], [place]   11 months ago Essential hypertension    [place] [place], [place], [place]

## 2023-03-08 ENCOUNTER — Ambulatory Visit: Payer: Self-pay

## 2023-03-08 ENCOUNTER — Telehealth: Payer: Self-pay

## 2023-03-08 DIAGNOSIS — J45909 Unspecified asthma, uncomplicated: Secondary | ICD-10-CM

## 2023-03-08 MED ORDER — ALBUTEROL SULFATE (2.5 MG/3ML) 0.083% IN NEBU
2.5000 mg | INHALATION_SOLUTION | Freq: Four times a day (QID) | RESPIRATORY_TRACT | 0 refills | Status: DC | PRN
Start: 2023-03-08 — End: 2023-12-28

## 2023-03-08 NOTE — Telephone Encounter (Signed)
Copied from CRM [id number]. Topic: General - Other >> Mar 08, 2023 12:28 PM Santiya F wrote: Reason for CRM: Pt is calling in because he says Chassidy or someone is supposed to call him around 12:30 regarding his albuterol solution. Pt says he is going on lunch and to leave a VM if he doesn't answer.

## 2023-03-08 NOTE — Telephone Encounter (Signed)
Chief Complaint: SOB Symptoms: mild to moderate SOB, mild wheezing  Frequency: comes and goes  Pertinent Negatives: Patient denies chest pain, fever, nausea, vomiting Disposition: [] ED /[] Urgent Care (no appt availability in office) / [] Appointment(In office/virtual)/ []  [place] [facility]/ [] Home Care/ [x] Refused Recommended Disposition /[] [place] [facility]/ []  Follow-up with PCP Additional Notes: Patient states he has been experiencing mild to moderate SOB with mild wheezing that comes and goes for about 2 weeks now. Patient states these symptoms have caused him to use his nebulizer solution more often and now he is completely out. Care advice was given and patient was offered an appointment tomorrow but patient stated he can not come in until Nov 27 and PCP does not have availability that day. Patient states he is just requesting a refill for is medicine at this time. Advised it may be too soon for a refill and it is possible that insurance will not cover it at this time. Patient stated he would like it refilled anyway.  Reason for Disposition  [1] MODERATE longstanding difficulty breathing (e.g., speaks in phrases, SOB even at rest, pulse 100-120) AND [2] SAME as normal  Answer Assessment - Initial Assessment Questions 1. RESPIRATORY STATUS: "Describe your breathing?" (e.g., wheezing, shortness of breath, unable to speak, severe coughing)      Mild SOB, wheezing 2. ONSET: "When did this breathing problem begin?"      2 weeks now 3. PATTERN "Does the difficult breathing come and go, or has it been constant since it started?"      Comes and goes  4. SEVERITY: "How bad is your breathing?" (e.g., mild, moderate, severe)    - MILD: No SOB at rest, mild SOB with walking, speaks normally in sentences, can lie down, no retractions, pulse < 100.    - MODERATE: SOB at rest, SOB with minimal exertion and prefers to sit, cannot lie down flat, speaks in phrases, mild retractions, audible  wheezing, pulse 100-120.    - SEVERE: Very SOB at rest, speaks in single words, struggling to breathe, sitting hunched forward, retractions, pulse > 120      Mild to Moderate  5. RECURRENT SYMPTOM: "Have you had difficulty breathing before?" If Yes, ask: "When was the last time?" and "What happened that time?"      Yes, I use my nebulizer when this happens   7. LUNG HISTORY: "Do you have any history of lung disease?"  (e.g., pulmonary embolus, asthma, emphysema)     Asthma 8. CAUSE: "What do you think is causing the breathing problem?"      Seasonal changes  9. OTHER SYMPTOMS: "Do you have any other symptoms? (e.g., dizziness, runny nose, cough, chest pain, fever)     No  Protocols used: Breathing Difficulty-A-AH

## 2023-03-08 NOTE — Telephone Encounter (Signed)
Requested Prescriptions  Pending Prescriptions Disp Refills   albuterol (PROVENTIL) (2.5 MG/3ML) 0.083% nebulizer solution 90 mL 0    Sig: Take 3 mLs (2.5 mg total) by nebulization every 6 (six) hours as needed for wheezing or shortness of breath.     Pulmonology:  Beta Agonists 2 Failed - 03/08/2023  1:00 PM      Failed - Last BP in normal range    BP Readings from Last 1 Encounters:  11/01/22 (!) 124/93         Passed - Last Heart Rate in normal range    Pulse Readings from Last 1 Encounters:  11/01/22 60         Passed - Valid encounter within last 12 months    Recent Outpatient Visits           6 months ago Essential hypertension   [place] [facility] [place], [place], [place]   11 months ago Essential hypertension   [place] [facility] [place], [place], [place]   11 months ago Essential hypertension    [facility] [place], [place], [place]

## 2023-03-10 NOTE — Telephone Encounter (Signed)
LMTCB-We do not have a Chassity in the office and just want to follow-up with patient about what his concerns are on the Albuterol. If patient calls back ok for [facility] Nurse to get more information.

## 2023-03-13 ENCOUNTER — Other Ambulatory Visit: Payer: Self-pay | Admitting: Physician Assistant

## 2023-03-13 DIAGNOSIS — J45909 Unspecified asthma, uncomplicated: Secondary | ICD-10-CM

## 2023-03-14 ENCOUNTER — Other Ambulatory Visit: Payer: Self-pay | Admitting: Physician Assistant

## 2023-03-14 DIAGNOSIS — J45909 Unspecified asthma, uncomplicated: Secondary | ICD-10-CM

## 2023-03-14 NOTE — Telephone Encounter (Signed)
Requested Prescriptions  Pending Prescriptions Disp Refills   albuterol (VENTOLIN HFA) 108 (90 Base) MCG/ACT inhaler [Pharmacy Med Name: Albuterol Sulfate HFA 108 (90 Base) MCG/ACT Inhalation Aerosol Solution] 18 g 1    Sig: INHALE 2 PUFFS BY MOUTH EVERY 6 HOURS AS NEEDED FOR WHEEZING FOR SHORTNESS OF BREATH     Pulmonology:  Beta Agonists 2 Failed - 03/13/2023 10:43 AM      Failed - Last BP in normal range    BP Readings from Last 1 Encounters:  11/01/22 (!) 124/93         Passed - Last Heart Rate in normal range    Pulse Readings from Last 1 Encounters:  11/01/22 60         Passed - Valid encounter within last 12 months    Recent Outpatient Visits           6 months ago Essential hypertension   [place] [facility] [place], [place], PA-C   11 months ago Essential hypertension   [place] [facility] [place], [place], [place]   12 months ago Essential hypertension   [place] [facility] [place], [place] [employer] [employer], [place]

## 2023-03-14 NOTE — Telephone Encounter (Signed)
Medication Refill -  Most Recent Primary Care Visit:  Provider: [name]  Department: [facility]  Visit Type: HOSPITAL FU  Date: 09/02/2022  Medication: albuterol (VENTOLIN HFA) 108 (90 Base) MCG/ACT inhaler   Has the patient contacted their pharmacy? Yes (Agent: If no, request that the patient contact the pharmacy for the refill. If patient does not wish to contact the pharmacy document the reason why and proceed with request.) (Agent: If yes, when and what did the pharmacy advise?)  Is this the correct pharmacy for this prescription? Yes If no, delete pharmacy and type the correct one.  This is the patient's preferred pharmacy:  [facility] [address]), [place] - [address] [place]) [place] [address] Phone: [phone] Fax: [phone]   Has the prescription been filled recently? Yes  Is the patient out of the medication? Yes  Has the patient been seen for an appointment in the last year OR does the patient have an upcoming appointment? Yes  Can we respond through MyChart? No  Agent: Please be advised that Rx refills may take up to 3 business days. We ask that you follow-up with your pharmacy.

## 2023-03-20 ENCOUNTER — Emergency Department
Admission: EM | Admit: 2023-03-20 | Discharge: 2023-03-20 | Disposition: A | Discharge status: Home / Self Care | Payer: 59 | Attending: Emergency Medicine | Admitting: Emergency Medicine

## 2023-03-20 ENCOUNTER — Other Ambulatory Visit: Payer: Self-pay

## 2023-03-20 ENCOUNTER — Emergency Department: Payer: 59

## 2023-03-20 DIAGNOSIS — Z1152 Encounter for screening for COVID-19: Secondary | ICD-10-CM | POA: Insufficient documentation

## 2023-03-20 DIAGNOSIS — J4521 Mild intermittent asthma with (acute) exacerbation: Secondary | ICD-10-CM

## 2023-03-20 DIAGNOSIS — I10 Essential (primary) hypertension: Secondary | ICD-10-CM | POA: Diagnosis not present

## 2023-03-20 DIAGNOSIS — Z79899 Other long term (current) drug therapy: Secondary | ICD-10-CM | POA: Insufficient documentation

## 2023-03-20 DIAGNOSIS — R0602 Shortness of breath: Secondary | ICD-10-CM

## 2023-03-20 DIAGNOSIS — J45901 Unspecified asthma with (acute) exacerbation: Secondary | ICD-10-CM

## 2023-03-20 LAB — CBC WITH DIFFERENTIAL/PLATELET
Abs Immature Granulocytes: 0.02 10*3/uL (ref 0.00–0.07)
Basophils Absolute: 0 10*3/uL (ref 0.0–0.1)
Basophils Relative: 1 %
Eosinophils Absolute: 0.5 10*3/uL (ref 0.0–0.5)
Eosinophils Relative: 8 %
HCT: 51.2 % (ref 39.0–52.0)
Hemoglobin: 17.1 g/dL — ABNORMAL HIGH (ref 13.0–17.0)
Immature Granulocytes: 0 %
Lymphocytes Relative: 24 %
Lymphs Abs: 1.5 10*3/uL (ref 0.7–4.0)
MCH: 28.1 pg (ref 26.0–34.0)
MCHC: 33.4 g/dL (ref 30.0–36.0)
MCV: 84.2 fL (ref 80.0–100.0)
Monocytes Absolute: 0.4 10*3/uL (ref 0.1–1.0)
Monocytes Relative: 7 %
Neutro Abs: 3.8 10*3/uL (ref 1.7–7.7)
Neutrophils Relative %: 60 %
Platelets: 203 10*3/uL (ref 150–400)
RBC: 6.08 MIL/uL — ABNORMAL HIGH (ref 4.22–5.81)
RDW: 13.6 % (ref 11.5–15.5)
WBC: 6.3 10*3/uL (ref 4.0–10.5)
nRBC: 0 % (ref 0.0–0.2)

## 2023-03-20 LAB — TROPONIN I (HIGH SENSITIVITY)
Troponin I (High Sensitivity): 6 ng/L (ref <18)
Troponin I (High Sensitivity): 6 ng/L (ref <18)

## 2023-03-20 LAB — RESP PANEL BY RT-PCR (RSV, FLU A&B, COVID)  RVPGX2
Influenza A by PCR: NEGATIVE
Influenza B by PCR: NEGATIVE
Resp Syncytial Virus by PCR: NEGATIVE
SARS Coronavirus 2 by RT PCR: NEGATIVE

## 2023-03-20 LAB — COMPREHENSIVE METABOLIC PANEL
ALT: 21 U/L (ref 0–44)
AST: 21 U/L (ref 15–41)
Albumin: 4.3 g/dL (ref 3.5–5.0)
Alkaline Phosphatase: 53 U/L (ref 38–126)
Anion gap: 11 (ref 5–15)
BUN: 16 mg/dL (ref 6–20)
CO2: 24 mmol/L (ref 22–32)
Calcium: 9.2 mg/dL (ref 8.9–10.3)
Chloride: 101 mmol/L (ref 98–111)
Creatinine, Ser: 0.81 mg/dL (ref 0.61–1.24)
GFR, Estimated: >60 mL/min (ref >60)
Glucose, Bld: 106 mg/dL — ABNORMAL HIGH (ref 70–99)
Potassium: 3.7 mmol/L (ref 3.5–5.1)
Sodium: 136 mmol/L (ref 135–145)
Total Bilirubin: 1.4 mg/dL — ABNORMAL HIGH (ref <1.2)
Total Protein: 7.2 g/dL (ref 6.5–8.1)

## 2023-03-20 LAB — BRAIN NATRIURETIC PEPTIDE: B Natriuretic Peptide: 38.3 pg/mL (ref 0.0–100.0)

## 2023-03-20 MED ORDER — AZITHROMYCIN 250 MG PO TABS: ORAL_TABLET | ORAL | 0 refills | Status: AC

## 2023-03-20 MED ORDER — IPRATROPIUM-ALBUTEROL 0.5-2.5 (3) MG/3ML IN SOLN
3.0000 mL | Freq: Once | RESPIRATORY_TRACT | Status: AC
  Administered 2023-03-20: 3 mL via RESPIRATORY_TRACT
  Filled 2023-03-20: qty 3

## 2023-03-20 MED ORDER — IPRATROPIUM-ALBUTEROL 0.5-2.5 (3) MG/3ML IN SOLN: 3.0000 mL | RESPIRATORY_TRACT | 3 refills | Status: AC | PRN

## 2023-03-20 MED ORDER — METHYLPREDNISOLONE SODIUM SUCC 125 MG IJ SOLR
125.0000 mg | Freq: Once | INTRAMUSCULAR | Status: AC
  Administered 2023-03-20: 125 mg via INTRAVENOUS
  Filled 2023-03-20: qty 2

## 2023-03-20 MED ORDER — PREDNISONE 10 MG (21) PO TBPK: ORAL_TABLET | ORAL | 0 refills | Status: AC

## 2023-03-20 NOTE — ED Provider Notes (Signed)
[facility] Provider Note    Event Date/Time   First MD Initiated Contact with Patient 03/20/23 [phone]     (approximate)   History   Asthma   HPI  Ruben Gonzalez is a 52 y.o. male who presents to the ED from home with a chief complaint of shortness of breath with wheezing.  Patient with a history of asthma, hospitalized last in the spring of this year.  Never been intubated.  Reports wheezing with increased shortness of breath.  Used albuterol nebulizer and inhaler at home without relief of symptoms.  Similar symptoms last week.  Denies fever/chills, chest pain, abdominal pain, nausea, vomiting or dizziness.     Past Medical History   Past Medical History:  Diagnosis Date   Allergies    Asthma    Hypertension      Active Problem List   Patient Active Problem List   Diagnosis Date Noted   Acute respiratory failure with hypoxia (HCC) 08/08/2022   CAP (community acquired pneumonia) 08/08/2022   Hypertensive urgency 08/08/2022   Acute severe exacerbation of asthma 08/07/2022   Acute exacerbation of extrinsic asthma 02/10/2022   Overweight (BMI 25.0-29.9) 08/08/2021   Acute on chronic diastolic CHF (congestive heart failure) (HCC) 08/08/2021   Acute hypoxic respiratory failure (HCC)    Essential hypertension    Asthma exacerbation 08/13/2020   Unresponsive episode 01/31/2016   Acute renal failure (HCC) 01/31/2016   Hypokalemia 01/31/2016   Alcohol abuse 01/31/2016   Marijuana abuse 01/31/2016   Hyperglycemia 01/31/2016   Carotid artery stenosis 01/31/2016   Dehydration 01/31/2016     Past Surgical History   Past Surgical History:  Procedure Laterality Date   COLONOSCOPY WITH PROPOFOL N/A 11/01/2022   Procedure: COLONOSCOPY WITH PROPOFOL;  Surgeon: Midge Minium, MD;  Location: [facility] ENDOSCOPY;  Service: Endoscopy;  Laterality: N/A;   POLYPECTOMY  11/01/2022   Procedure: POLYPECTOMY;  Surgeon: Midge Minium, MD;  Location: [facility] ENDOSCOPY;   Service: Endoscopy;;   TONSILLECTOMY     VASECTOMY       Home Medications   Prior to Admission medications   Medication Sig Start Date End Date Taking? Authorizing Provider  albuterol (PROVENTIL) (2.5 MG/3ML) 0.083% nebulizer solution Take 3 mLs (2.5 mg total) by nebulization every 6 (six) hours as needed for wheezing or shortness of breath. 03/08/23   Ostwalt, Edmon Crape, PA-C  albuterol (VENTOLIN HFA) 108 (90 Base) MCG/ACT inhaler INHALE 2 PUFFS BY MOUTH EVERY 6 HOURS AS NEEDED FOR WHEEZING FOR SHORTNESS OF BREATH 03/14/23   Ostwalt, Edmon Crape, PA-C  carvedilol (COREG) 25 MG tablet Take 1 tablet (25 mg total) by mouth 2 (two) times daily. 12/13/22   [name], PA-C  hydrALAZINE (APRESOLINE) 25 MG tablet Take 1 tablet (25 mg total) by mouth 4 (four) times daily. 08/10/22 08/10/23  Willeen Niece, MD     Allergies  Patient has no known allergies.   Family History   Family History  Problem Relation Age of Onset   Heart attack Mother    Allergies Mother      Physical Exam  Triage Vital Signs: ED Triage Vitals  Encounter Vitals Group     BP 03/20/23 0535 (!) 188/128     Systolic BP Percentile --      Diastolic BP Percentile --      Pulse Rate 03/20/23 0531 90     Resp 03/20/23 0531 (!) 22     Temp 03/20/23 0531 98.4 F (36.9 C)     Temp  src --      SpO2 03/20/23 0531 92 %     Weight 03/20/23 0532 170 lb (77.1 kg)     Height --      Head Circumference --      Peak Flow --      Pain Score 03/20/23 0532 0     Pain Loc --      Pain Education --      Exclude from Growth Chart --     Updated Vital Signs: BP (!) 188/128   Pulse 90   Temp 98.4 F (36.9 C)   Resp (!) 22   Wt 77.1 kg   SpO2 92%   BMI 25.10 kg/m    General: Awake, mild distress.  CV:  RRR.  Good peripheral perfusion.  Resp:  Increased effort.  Audible wheezing. Abd:  Nontender.  No distention.  Other:  Bilateral calves are supple and nontender.   ED Results / Procedures / Treatments  Labs (all  labs ordered are listed, but only abnormal results are displayed) Labs Reviewed  RESP PANEL BY RT-PCR (RSV, FLU A&B, COVID)  [id number]  CBC WITH DIFFERENTIAL/PLATELET  COMPREHENSIVE METABOLIC PANEL  BRAIN NATRIURETIC PEPTIDE  TROPONIN I (HIGH SENSITIVITY)     EKG  ED ECG REPORT I, Aarna Mihalko J, the attending physician, personally viewed and interpreted this ECG.   Date: 03/20/2023  EKG Time: 0638  Rate: 76  Rhythm: normal sinus rhythm  Axis: Normal  Intervals:none  ST&T Change: Nonspecific    RADIOLOGY I have independently visualized and interpreted patient's imaging study as well as noted the radiology interpretation:  Chest x-ray: No acute cardiopulmonary process  Official radiology report(s): DG Chest Port 1 View  Result Date: 03/20/2023 CLINICAL DATA:  Shortness of breath and wheezing. EXAM: PORTABLE CHEST 1 VIEW COMPARISON:  None Available. FINDINGS: The heart size and mediastinal contours are within normal limits. Both lungs are clear. The visualized skeletal structures are unremarkable. IMPRESSION: No evidence of acute chest disease.  Unchanged. Electronically Signed   By: Almira Bar M.D.   On: 03/20/2023 06:36     PROCEDURES:  Critical Care performed: No  .1-3 Lead EKG Interpretation  Performed by: Irean Hong, MD Authorized by: Irean Hong, MD     Interpretation: normal     ECG rate:  90   ECG rate assessment: normal     Rhythm: sinus rhythm     Ectopy: none     Conduction: normal   Comments:     Patient placed on cardiac monitor to evaluate for arrhythmias    MEDICATIONS ORDERED IN ED: Medications  methylPREDNISolone sodium succinate (SOLU-MEDROL) 125 mg/2 mL injection 125 mg (has no administration in time range)  ipratropium-albuterol (DUONEB) 0.5-2.5 (3) MG/3ML nebulizer solution 3 mL (has no administration in time range)     IMPRESSION / MDM / ASSESSMENT AND PLAN / ED COURSE  I reviewed the triage vital signs and the nursing notes.                              52 year old male presenting with shortness of breath. Differential includes, but is not limited to, viral syndrome, bronchitis including COPD exacerbation, pneumonia, reactive airway disease including asthma, CHF including exacerbation with or without pulmonary/interstitial edema, pneumothorax, ACS, thoracic trauma, and pulmonary embolism.  I personally reviewed patient's records and note colonoscopy on 11/01/2022.  Patient's presentation is most consistent with acute presentation with potential threat to  life or bodily function.  The patient is on the cardiac monitor to evaluate for evidence of arrhythmia and/or significant heart rate changes.  Will obtain cardiac panel, chest x-ray.  Initiate IV Solu-Medrol, DuoNeb.  Patient reports compliance with antihypertensives.  Will place on monitor.  Will reassess.  Clinical Course as of 03/20/23 0655  Mon Mar 20, 2023  4696 Care transferred to oncoming provider at change of shift pending laboratory results and reassessment. [JS]    Clinical Course User Index [JS] Irean Hong, MD     FINAL CLINICAL IMPRESSION(S) / ED DIAGNOSES   Final diagnoses:  Moderate asthma with exacerbation, unspecified whether persistent  Shortness of breath     Rx / DC Orders   ED Discharge Orders     None        Note:  This document was prepared using Dragon voice recognition software and may include unintentional dictation errors.   Irean Hong, MD 03/20/23 [phone]

## 2023-03-20 NOTE — Discharge Instructions (Signed)
Return to the emergency department for new or worsening Take medication as prescribed

## 2023-03-20 NOTE — ED Triage Notes (Signed)
Pt reports ongoing SHOB with wheezing since last night. Pt reports using albuterol nebulizer at home without relief.

## 2023-03-20 NOTE — ED Provider Notes (Signed)
  Physical Exam  BP (!) 188/128   Pulse 90   Temp 98.4 F (36.9 C)   Resp (!) 22   Wt 77.1 kg   SpO2 92%   BMI 25.10 kg/m   Physical Exam  Procedures  Procedures  ED Course / MDM   Clinical Course as of 03/20/23 0827  Mon Mar 20, 2023  2130 Care transferred to oncoming provider at change of shift pending laboratory results and reassessment. [JS]    Clinical Course User Index [JS] Irean Hong, MD   Medical Decision Making Amount and/or Complexity of Data Reviewed Labs: ordered. Radiology: ordered.  Risk Prescription drug management.   Assuming care from Chiquita Loth, MD, plan at this time is to reassess patient, see if blood pressure is improving, see if respiratory symptoms have improved with DuoNeb.  Consider giving second and third DuoNeb.  If not improving then to admit to the hospital.  I did go and recheck the patient.  He still has a large amount of wheezing.  Ordered second DuoNeb.  Patient still has some mild wheezing.  However he states he feels much better.  Would like to go home and not be admitted.  Shared decision making agreed to let him go home with strict instructions to return if worsening.  Since he is feeling better feel that he is stable.  Will go ahead and send in a prescription for Sterapred, Z-Pak, DuoNeb Nebules.  Patient has a nebulizer machine at home.  Strict instructions to return if worsening.  In agreement treatment plan.  Given work note discharged stable condition.       Faythe Ghee, PA-C 03/20/23 0901    Janith Lima, MD 03/21/23 [phone]

## 2023-04-24 ENCOUNTER — Other Ambulatory Visit: Payer: Self-pay | Admitting: Physician Assistant

## 2023-04-24 DIAGNOSIS — J45909 Unspecified asthma, uncomplicated: Secondary | ICD-10-CM

## 2023-07-18 IMAGING — CT CT CHEST W/O CM
2 of 4 series · 14 of 36 positions shown, 17 images · non-contrast
Comparison: Portable chest today, PA Lat 09/28/2020. No prior
cross-sectional imaging for comparison.

CLINICAL DATA: Worsening shortness of breath onset this morning. No
improvement with albuterol inhaler. Hypoxic requiring 3 L
supplemental oxygen.



[Series 2: thorax · axial · 0.75mm/px · z∈[-668,-410]mm · 11 of 153 slices shown, 14 images]
[im 12/153  mediastinal]
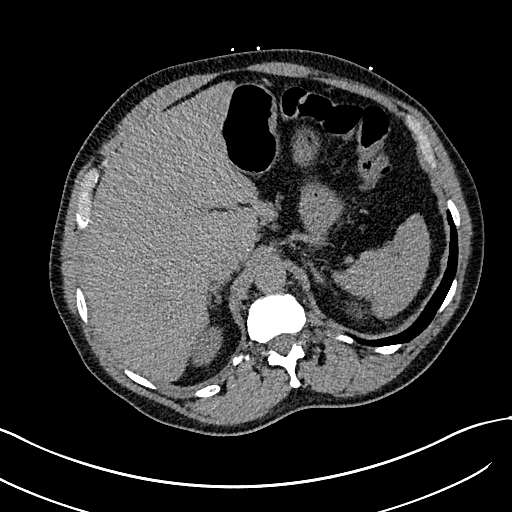
[im 12/153  lung]
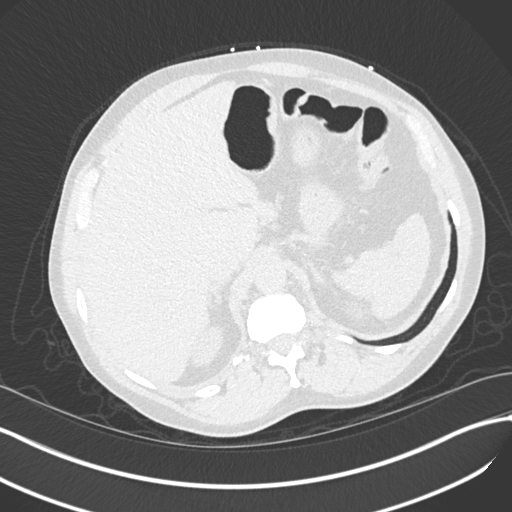
[im 24/153  lung]
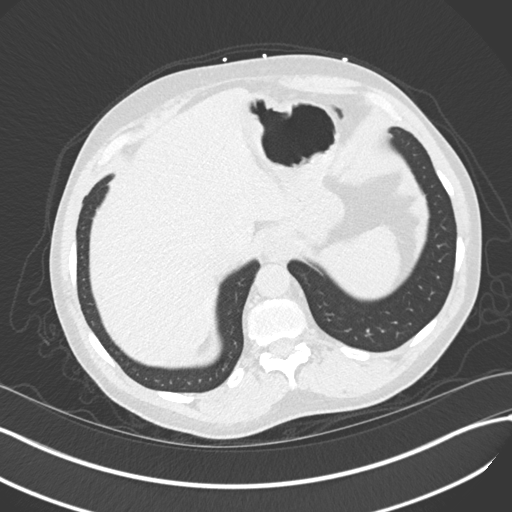
[im 36/153  lung]
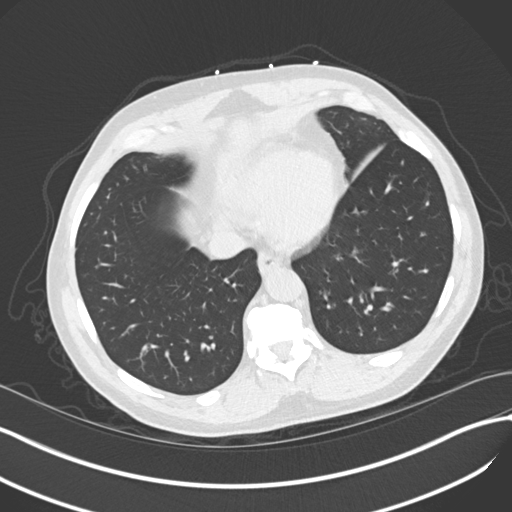
[im 47/153  lung]
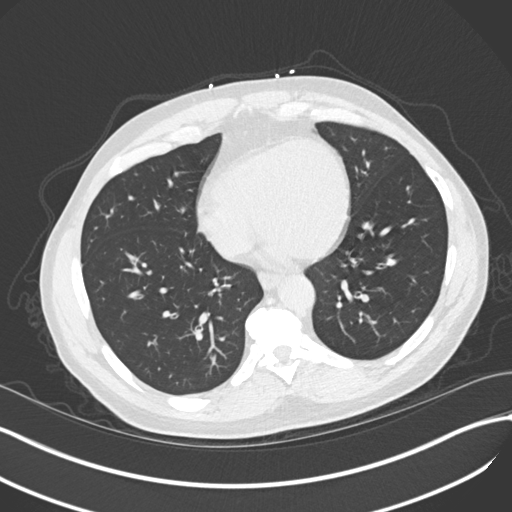
[im 59/153  mediastinal]
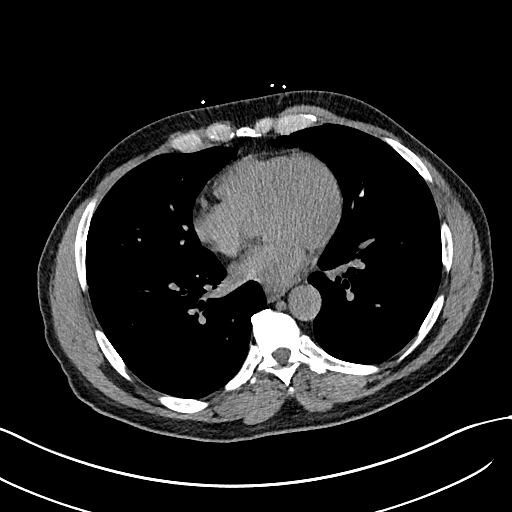
[im 59/153  lung]
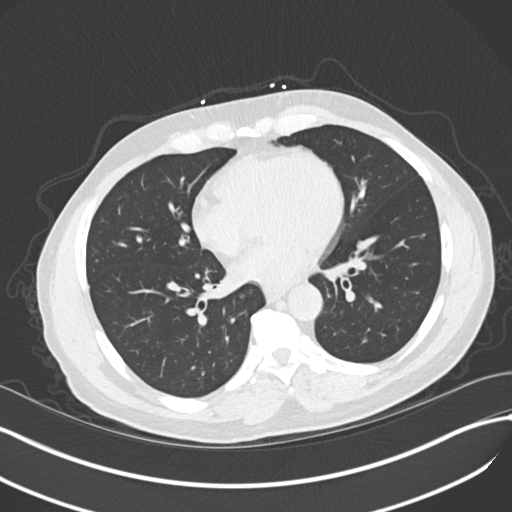
[im 82/153  lung]
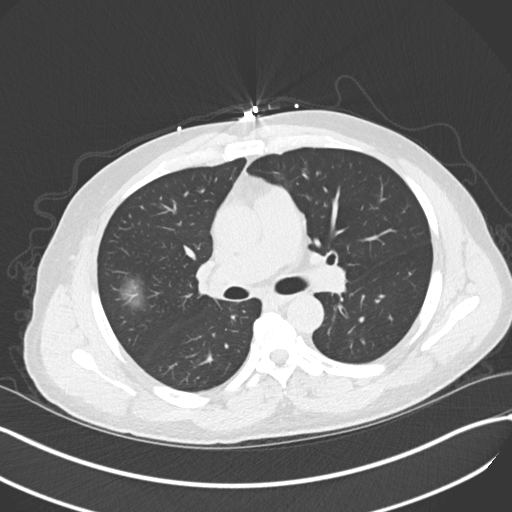
[im 94/153  lung]
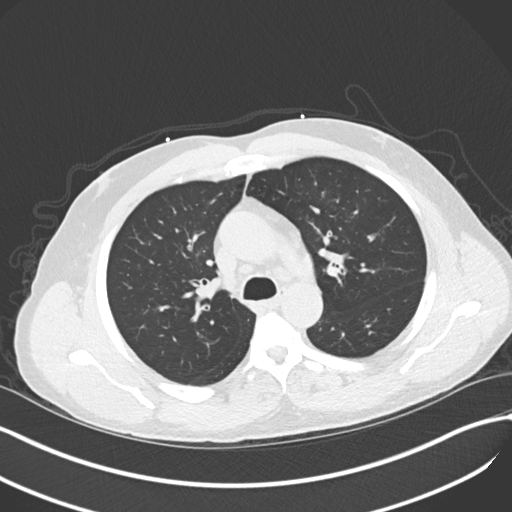
[im 106/153  lung]
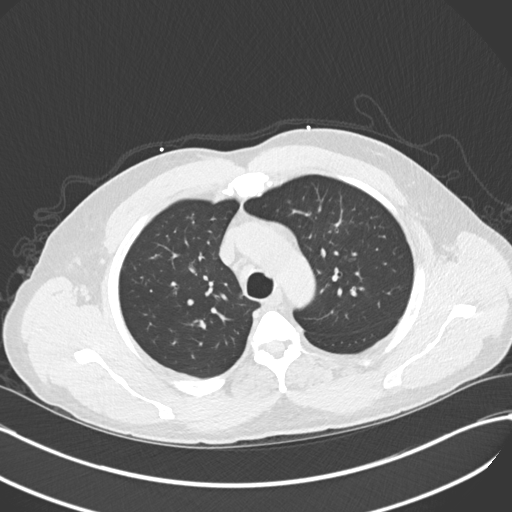
[im 117/153  mediastinal]
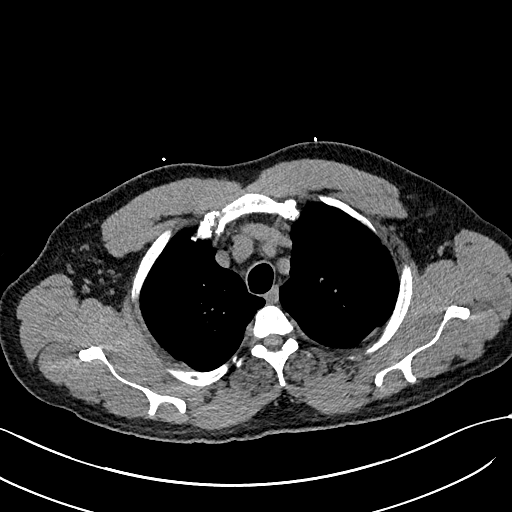
[im 117/153  lung]
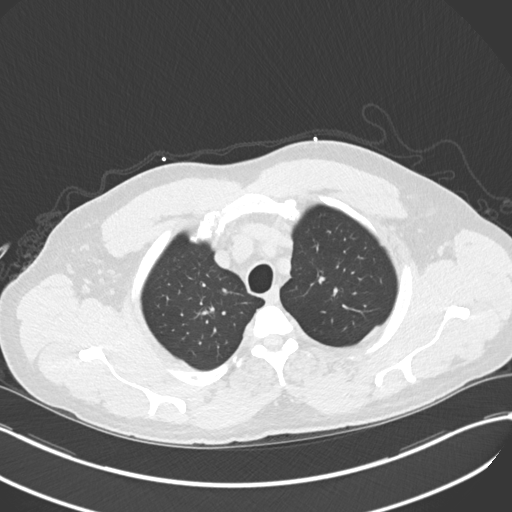
[im 129/153  lung]
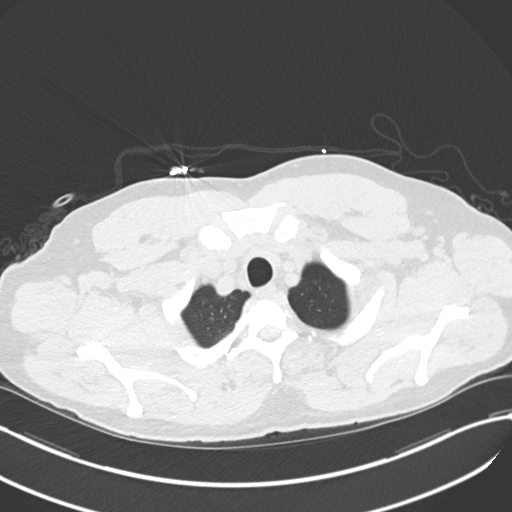
[im 141/153  lung]
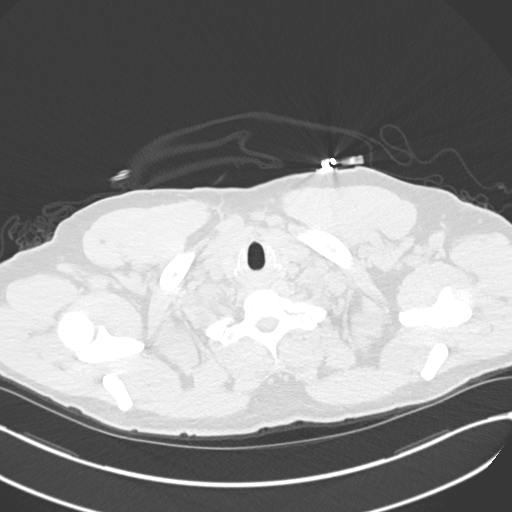

[Series 5: coronal · coronal · 0.62mm/px · 3 of 151 slices shown]
[im 31/151  lung]
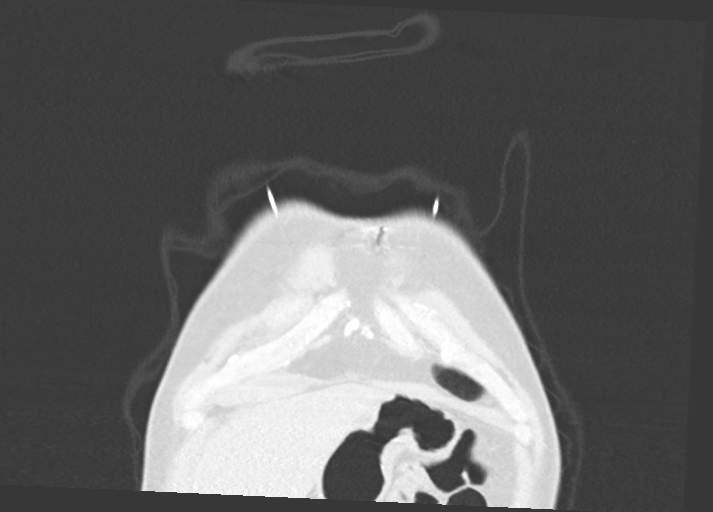
[im 61/151  lung]
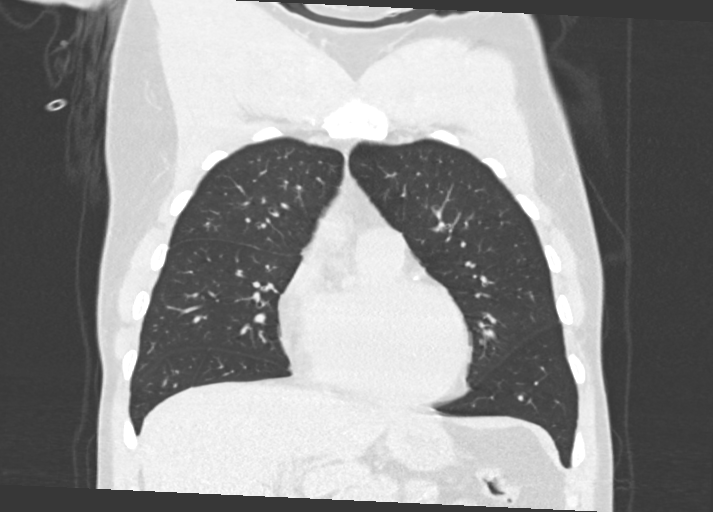
[im 91/151  lung]
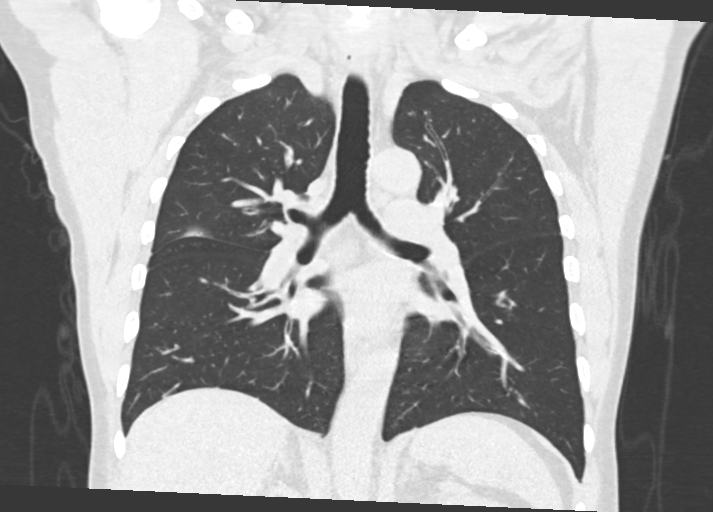

[14 of 36 positions shown; findings below may reference images not displayed]

FINDINGS: Cardiovascular: Cardiac size is normal. There is minimal pericardial
effusion anteriorly. Scattered calcification LAD coronary artery.
There is trace aortic atherosclerosis in the distal arch.

There is no aortic aneurysm. Pulmonary veins are decompressed. The
pulmonary arteries are normal caliber. There is normal great vessel
branching.

Mediastinum/Nodes: There are shotty subcentimeter in short axis
mediastinal lymph nodes but no bulky or encasing adenopathy. There
is no thyroid or axillary mass. There is no esophageal thickening or
hiatal hernia. The trachea is patent.

Lungs/Pleura: Central bronchi are diffusely thickened. There are
scattered small bronchial impactions in the bilateral lower lobe
basal segments and in portions of the suprahilar areas of both upper
lobes.

There are mild paraseptal emphysematous changes in the lung apices.
There are few linear scar-like opacities in the bases but no
subpleural fibrosis.

There is a focal ground-glass opacity in the posterior segment of
the right upper lobe along the base of the upper lobe. There are no
further focal infiltrates.

There are no nodules. There is no pleural effusion, thickening or
pneumothorax.

Upper Abdomen: No acute abnormality.

Musculoskeletal: No chest wall mass or suspicious bone lesions
identified.
IMPRESSION: 1. Scattered small bronchial impactions in both upper and both lower
lobes, in keeping with small airways disease likely of
other-than-aspiration etiology, with background central bronchitis.
2. Focal ground-glass infiltrate in the posterior base of the right
upper lobe probably due to a small pneumonia. A follow-up study is
recommended to ensure clearing.
3. Early COPD changes lung apices. No nodules or subpleural
fibrosis.
4. Single vessel calcific CAD, LAD coronary artery and minimal
anterior pericardial fluid.
5. Trace aortic atherosclerosis.  No aneurysm.

## 2023-08-03 ENCOUNTER — Other Ambulatory Visit: Payer: Self-pay | Admitting: Physician Assistant

## 2023-08-03 DIAGNOSIS — J45909 Unspecified asthma, uncomplicated: Secondary | ICD-10-CM

## 2023-08-04 NOTE — Telephone Encounter (Signed)
 Patient will need an office visit for additional refills. Requested Prescriptions  Pending Prescriptions Disp Refills   albuterol (VENTOLIN HFA) 108 (90 Base) MCG/ACT inhaler [Pharmacy Med Name: Albuterol Sulfate HFA 108 (90 Base) MCG/ACT Inhalation Aerosol Solution] 18 g 0    Sig: INHALE 2 PUFFS BY MOUTH EVERY 6 HOURS AS NEEDED FOR WHEEZING FOR SHORTNESS OF BREATH     Pulmonology:  Beta Agonists 2 Failed - 08/04/2023 12:46 PM      Failed - Last BP in normal range    BP Readings from Last 1 Encounters:  03/20/23 (!) 160/99         Failed - Valid encounter within last 12 months    Recent Outpatient Visits   None            Passed - Last Heart Rate in normal range    Pulse Readings from Last 1 Encounters:  03/20/23 76

## 2023-08-22 ENCOUNTER — Telehealth: Payer: Self-pay | Admitting: Physician Assistant

## 2023-08-22 MED ORDER — CARVEDILOL 25 MG PO TABS: 25.0000 mg | ORAL_TABLET | Freq: Two times a day (BID) | ORAL | 1 refills | Status: DC

## 2023-08-22 NOTE — Telephone Encounter (Signed)
 [facility] faxed refill request for the following medications:    carvedilol  (COREG ) 25 MG tablet   Please advise

## 2023-09-06 ENCOUNTER — Ambulatory Visit (INDEPENDENT_AMBULATORY_CARE_PROVIDER_SITE_OTHER): Admitting: Internal Medicine

## 2023-09-06 ENCOUNTER — Encounter: Payer: Self-pay | Admitting: Internal Medicine

## 2023-09-06 VITALS — BP 130/90 | HR 59 | Temp 98.7°F | Ht 69.0 in | Wt 183.8 lb

## 2023-09-06 DIAGNOSIS — J45909 Unspecified asthma, uncomplicated: Secondary | ICD-10-CM

## 2023-09-06 DIAGNOSIS — J45901 Unspecified asthma with (acute) exacerbation: Secondary | ICD-10-CM

## 2023-09-06 LAB — NITRIC OXIDE: Nitric Oxide: 93

## 2023-09-06 NOTE — Progress Notes (Signed)
 [facility] Pulmonary Medicine Consultation      Date: 09/06/2023,   MRN# [id number] Skyelar Gochanour Jul 25, 1970     CHIEF COMPLAINT:   Assessment shortness of breath   HISTORY OF PRESENT ILLNESS   53 year old male here today for assessment of asthma Patient has childhood asthma and has been having shortness of breath dyspnea exertion for many years Intermittent wheezing and shortness of breath with exertion Patient does have seasonal allergies Triggers include dust weather smoke Patient is on Proventil  nebulizers Ventolin  albuterol  MDIs and DuoNebs Patient has tried Advair in the past currently takes Trelegy inhaler and he said it works really well Eosinophilic count November 2024 was 500 Last asthma exacerbation was 1 year ago  Assessment of ASTHMA FeNO 93    ppb-Elevated exhaled Nitric oxide testing is highly consistent with type II inflammation -continue inhalers as prescribed  No exacerbation at this time No evidence of heart failure at this time No evidence or signs of infection at this time No respiratory distress No fevers, chills, nausea, vomiting, diarrhea No evidence of lower extremity edema No evidence hemoptysis  Triggers include dust smoke change in weather roaches cats dogs perfume cologne, nuts  CBC    Component Value Date/Time   WBC 6.3 03/20/2023 0654   RBC 6.08 (H) 03/20/2023 0654   HGB 17.1 (H) 03/20/2023 0654   HCT 51.2 03/20/2023 0654   PLT 203 03/20/2023 0654   MCV 84.2 03/20/2023 0654   MCH 28.1 03/20/2023 0654   MCHC 33.4 03/20/2023 0654   RDW 13.6 03/20/2023 0654   LYMPHSABS 1.5 03/20/2023 0654   MONOABS 0.4 03/20/2023 0654   EOSABS 0.5 03/20/2023 0654   BASOSABS 0.0 03/20/2023 0654          CT chest April 2023 Scattered small bronchial impactions in both upper and both lower lobes, in keeping with small airways disease likely of other-than-aspiration etiology, with background central bronchitis. Focal ground-glass  infiltrate in the posterior base of the right upper lobe probably due to a small pneumonia. A follow-up study is recommended to ensure clearing. Early COPD changes lung apices.  No nodules or subpleural fibrosis.  PAST MEDICAL HISTORY   Past Medical History:  Diagnosis Date   Allergies    Asthma    Hypertension      SURGICAL HISTORY   Past Surgical History:  Procedure Laterality Date   COLONOSCOPY WITH PROPOFOL  N/A 11/01/2022   Procedure: COLONOSCOPY WITH PROPOFOL ;  Surgeon: Marnee Sink, MD;  Location: [facility] ENDOSCOPY;  Service: Endoscopy;  Laterality: N/A;   POLYPECTOMY  11/01/2022   Procedure: POLYPECTOMY;  Surgeon: Marnee Sink, MD;  Location: [facility] ENDOSCOPY;  Service: Endoscopy;;   TONSILLECTOMY     VASECTOMY       FAMILY HISTORY   Family History  Problem Relation Age of Onset   Heart attack Mother    Allergies Mother      SOCIAL HISTORY   Social History   Tobacco Use   Smoking status: Never   Smokeless tobacco: Never  Vaping Use   Vaping status: Never Used  Substance Use Topics   Alcohol use: Yes    Alcohol/week: 14.0 standard drinks of alcohol    Types: 6 Cans of beer, 8 Shots of liquor per week   Drug use: Not Currently    Types: Marijuana     MEDICATIONS    Home Medication:  Current Outpatient Rx   Order #: [id number] Class: Normal   Order #: [id number] Class: Normal   Order #: [id number] Class: Normal  Order #: [id number] Class: Normal   Order #: [id number] Class: Normal   Order #: [id number] Class: Normal   Order #: [id number] Class: Normal    Current Medication:  Current Outpatient Medications:    albuterol  (PROVENTIL ) (2.5 MG/3ML) 0.083% nebulizer solution, Take 3 mLs (2.5 mg total) by nebulization every 6 (six) hours as needed for wheezing or shortness of breath., Disp: 90 mL, Rfl: 0   albuterol  (VENTOLIN  HFA) 108 (90 Base) MCG/ACT inhaler, INHALE 2 PUFFS BY MOUTH EVERY 6 HOURS AS NEEDED FOR WHEEZING FOR SHORTNESS OF BREATH, Disp: 18 g, Rfl: 0    azithromycin  (ZITHROMAX  Z-PAK) 250 MG tablet, 2 pills today then 1 pill a day for 4 days, Disp: 6 each, Rfl: 0   carvedilol  (COREG ) 25 MG tablet, Take 1 tablet (25 mg total) by mouth 2 (two) times daily., Disp: 60 tablet, Rfl: 1   hydrALAZINE  (APRESOLINE ) 25 MG tablet, Take 1 tablet (25 mg total) by mouth 4 (four) times daily., Disp: 120 tablet, Rfl: 11   ipratropium-albuterol  (DUONEB) 0.5-2.5 (3) MG/3ML SOLN, Take 3 mLs by nebulization every 4 (four) hours as needed., Disp: 360 mL, Rfl: 3   predniSONE  (STERAPRED UNI-PAK 21 TAB) 10 MG (21) TBPK tablet, Take 6 pills on day one then decrease by 1 pill each day, Disp: 21 tablet, Rfl: 0    ALLERGIES   Patient has no known allergies.  BP (!) 130/90 (BP Location: Right Arm, Patient Position: Sitting, Cuff Size: Normal)   Pulse (!) 59   Temp 98.7 F (37.1 C) (Oral)   Ht 5\' 9"  (1.753 m)   Wt 183 lb 12.8 oz (83.4 kg)   SpO2 97%   BMI 27.14 kg/m    Review of Systems: Gen:  Denies  fever, sweats, chills weight loss  HEENT: Denies blurred vision, double vision, ear pain, eye pain, hearing loss, nose bleeds, sore throat Cardiac:  No dizziness, chest pain or heaviness, chest tightness,edema, No JVD Resp:   No cough, -sputum production, -shortness of breath,-wheezing, -hemoptysis,  Other:  All other systems negative   Physical Examination:   General Appearance: No distress  EYES PERRLA, EOM intact.   NECK Supple, No JVD Pulmonary: normal breath sounds, No wheezing.  CardiovascularNormal S1,S2.  No m/r/g.   Abdomen: Benign, Soft, non-tender. Neurology UE/LE 5/5 strength, no focal deficits Ext pulses intact, cap refill intact ALL OTHER ROS ARE NEGATIVE        ASSESSMENT/PLAN   53 year old pleasant male seen today for assessment of asthma at this time.  His asthma seems to be under control with triple therapy with Trelegy No evidence of exacerbation at this time uses albuterol  as needed   Assessment of asthma No exacerbation at  this time No evidence of heart failure at this time No evidence or signs of infection at this time No respiratory distress No fevers, chills, nausea, vomiting, diarrhea No evidence of lower extremity edema No evidence hemoptysis Avoid Allergens and Irritants Avoid secondhand smoke Avoid SICK contacts Recommend  Masking  when appropriate Recommend Keep up-to-date with vaccinations Continue Trelegy as prescribed Rinse mouth after use Continue albuterol  as needed    MEDICATION ADJUSTMENTS/LABS AND TESTS ORDERED: Trelegy Rinse mouth after every use Avoid allergens   CURRENT MEDICATIONS REVIEWED AT LENGTH WITH PATIENT TODAY   Patient  satisfied with Plan of action and management. All questions answered  Follow up 6 months  I spent a total of 65 minutes reviewing chart data, face-to-face evaluation with the patient, counseling and coordination of care as detailed above.  Lady Pier, M.D.  [facility]  Medical Director [facility] Medical Director [facility] Cardio-Pulmonary Department

## 2023-09-06 NOTE — Patient Instructions (Signed)
 Continue Trelegy as prescribed Use albuterol  as needed  Avoid Allergens and Irritants Avoid secondhand smoke Avoid SICK contacts Recommend  Masking  when appropriate Recommend Keep up-to-date with vaccinations

## 2023-12-10 ENCOUNTER — Other Ambulatory Visit: Payer: Self-pay | Admitting: Physician Assistant

## 2023-12-10 DIAGNOSIS — J45909 Unspecified asthma, uncomplicated: Secondary | ICD-10-CM

## 2023-12-12 ENCOUNTER — Other Ambulatory Visit: Payer: Self-pay | Admitting: Physician Assistant

## 2023-12-12 DIAGNOSIS — J45909 Unspecified asthma, uncomplicated: Secondary | ICD-10-CM

## 2023-12-12 NOTE — Telephone Encounter (Unsigned)
 Copied from CRM [id number]. Topic: Clinical - Medication Refill >> Dec 12, 2023  1:51 PM Fonda T wrote: Medication:  albuterol  (PROVENTIL ) (2.5 MG/3ML) 0.083% nebulizer solution   And   albuterol  (VENTOLIN  HFA) 108 (90 Base) MCG/ACT inhaler   Has the patient contacted their pharmacy? Yes, pharmacy advised to contact office for refills, states refill request was faxed to office.   This is the patient's preferred pharmacy:   [facility] [address]), [place] - [address] [place]) [place] [address] Phone: [phone] Fax: [phone]  Is this the correct pharmacy for this prescription? Yes If no, delete pharmacy and type the correct one.   Has the prescription been filled recently? Yes  Is the patient out of the medication? Yes  Has the patient been seen for an appointment in the last year OR does the patient have an upcoming appointment? No last seen 08/2022  Can we respond through MyChart? No  Agent: Please be advised that Rx refills may take up to 3 business days. We ask that you follow-up with your pharmacy.

## 2023-12-13 ENCOUNTER — Other Ambulatory Visit: Payer: Self-pay | Admitting: Physician Assistant

## 2023-12-13 DIAGNOSIS — J45909 Unspecified asthma, uncomplicated: Secondary | ICD-10-CM

## 2023-12-15 NOTE — Telephone Encounter (Signed)
 Requested medication (s) are due for refill today: yes  Requested medication (s) are on the active medication list: yes  Last refill:  03/08/23  Future visit scheduled: yes  Notes to clinic:  Unable to refill per protocol, courtesy refill already given, routing for provider approval. Future OV scheduled 02/09/24     Requested Prescriptions  Pending Prescriptions Disp Refills   albuterol  (PROVENTIL ) (2.5 MG/3ML) 0.083% nebulizer solution 90 mL 0    Sig: Take 3 mLs (2.5 mg total) by nebulization every 6 (six) hours as needed for wheezing or shortness of breath.     Pulmonology:  Beta Agonists 2 Failed - 12/15/2023 10:51 AM      Failed - Last BP in normal range    BP Readings from Last 1 Encounters:  09/06/23 (!) 130/90         Failed - Valid encounter within last 12 months    Recent Outpatient Visits   None            Passed - Last Heart Rate in normal range    Pulse Readings from Last 1 Encounters:  09/06/23 (!) 59          albuterol  (VENTOLIN  HFA) 108 (90 Base) MCG/ACT inhaler 18 g 0     Pulmonology:  Beta Agonists 2 Failed - 12/15/2023 10:51 AM      Failed - Last BP in normal range    BP Readings from Last 1 Encounters:  09/06/23 (!) 130/90         Failed - Valid encounter within last 12 months    Recent Outpatient Visits   None            Passed - Last Heart Rate in normal range    Pulse Readings from Last 1 Encounters:  09/06/23 (!) 59

## 2023-12-19 ENCOUNTER — Ambulatory Visit: Payer: Self-pay

## 2023-12-19 NOTE — Telephone Encounter (Signed)
 FYI Only or Action Required?: FYI only for provider.  Patient was last seen in primary care on 09/02/22.  Called Nurse Triage reporting asthma.  Symptoms began on going.  Interventions attempted: Prescription medications: Albuterol  Nebulizer.  Symptoms are: stable.  Triage Disposition: See PCP When Office is Open (Within 3 Days)  Patient/caregiver understands and will follow disposition?: Yes   Copied from CRM [id number]. Topic: Clinical - Red Word Triage >> Dec 19, 2023 12:28 PM Marissa P wrote: Red Word that prompted transfer to Nurse Triage: Patient wheezes at night sometimes, completley out of his inhaler, no solution neither. Some shortness of breath as well, does need this inhaler not sure what the hold up is and its been over a month without it. Reason for Disposition  Caller requesting an appointment, triage offered and declined  Answer Assessment - Initial Assessment Questions Patient denies chest pain/ tightness, dizziness.     1. REASON FOR CALL or QUESTION: What is your reason for calling today? or How can I best     Out of his inhaler, needs appointment to refill. Experiencing some wheezing and SOB at night. Using nebulizer and finding relief.    2. CALLER: Document the source of call. (e.g., laboratory staff, caregiver or patient).     Patient  Protocols used: PCP Call - No Triage-A-AH

## 2023-12-28 ENCOUNTER — Encounter: Payer: Self-pay | Admitting: Physician Assistant

## 2023-12-28 ENCOUNTER — Ambulatory Visit (INDEPENDENT_AMBULATORY_CARE_PROVIDER_SITE_OTHER): Admitting: Physician Assistant

## 2023-12-28 VITALS — BP 174/94 | HR 60 | Temp 98.1°F | Ht 69.0 in | Wt 174.0 lb

## 2023-12-28 DIAGNOSIS — I10 Essential (primary) hypertension: Secondary | ICD-10-CM

## 2023-12-28 DIAGNOSIS — N528 Other male erectile dysfunction: Secondary | ICD-10-CM

## 2023-12-28 DIAGNOSIS — Z5986 Financial insecurity: Secondary | ICD-10-CM

## 2023-12-28 DIAGNOSIS — J45909 Unspecified asthma, uncomplicated: Secondary | ICD-10-CM

## 2023-12-28 MED ORDER — ALBUTEROL SULFATE HFA 108 (90 BASE) MCG/ACT IN AERS: 2.0000 | INHALATION_SPRAY | Freq: Four times a day (QID) | RESPIRATORY_TRACT | 0 refills | Status: DC | PRN

## 2023-12-28 MED ORDER — ALBUTEROL SULFATE (2.5 MG/3ML) 0.083% IN NEBU: 2.5000 mg | INHALATION_SOLUTION | Freq: Four times a day (QID) | RESPIRATORY_TRACT | 0 refills | Status: DC | PRN

## 2023-12-28 NOTE — Progress Notes (Signed)
 Established patient visit  Patient: Ruben Gonzalez   DOB: [birth year]   53 y.o. Male  MRN: [id number] Visit Date: 12/28/2023  Today's healthcare provider: Jolynn Spencer, PA-C   Chief Complaint  Patient presents with   Asthma    Patient presents for refills of inhalers, wakes up at night wheezing.    Erectile Dysfunction    Patient has been concerned with ED, reports he had this issue in the past but still has some problems with it.    Subjective     HPI     Asthma    Additional comments: Patient presents for refills of inhalers, wakes up at night wheezing.         Erectile Dysfunction    Additional comments: Patient has been concerned with ED, reports he had this issue in the past but still has some problems with it.       Last edited by Cherry Chiquita HERO, CMA on 12/28/2023  2:27 PM.       Discussed the use of AI scribe software for clinical note transcription with the patient, who gave verbal consent to proceed.  History of Present Illness Ruben Gonzalez is a 53 year old male with asthma who presents for medication management and follow-up.  He takes Trelegy daily for asthma maintenance and finds it significantly helpful. He uses albuterol  as a rescue inhaler once a day or less frequently, depending on symptoms. He has not used a nebulizer recently but uses the remaining solution once a day. He experiences no current shortness of breath and only uses the inhaler in emergencies, such as waking up with shortness of breath or wheezing.  His blood pressure was high during the visit due to missing his medication the previous night. He usually takes his blood pressure medication at night and checks his blood pressure at [facility], where it is typically normal. He attributes the high reading to work stress and missed medication.  He is concerned about erectile dysfunction and has been using Viagra for years, expressing interest in continuing this medication.      12/28/2023     2:28 PM 09/02/2022   10:55 AM 03/17/2022    1:33 PM  Depression screen PHQ 2/9  Decreased Interest 0 0 0  Down, Depressed, Hopeless 0 0 0  PHQ - 2 Score 0 0 0  Altered sleeping 0 0   Tired, decreased energy 0 0   Change in appetite 0 0   Feeling bad or failure about yourself  0 0   Trouble concentrating 0 0   Moving slowly or fidgety/restless 0 0   Suicidal thoughts 0 0   PHQ-9 Score 0 0   Difficult doing work/chores  Not difficult at all       12/28/2023    2:28 PM  GAD 7 : Generalized Anxiety Score  Nervous, Anxious, on Edge 0  Control/stop worrying 0  Worry too much - different things 0  Trouble relaxing 0  Restless 0  Easily annoyed or irritable 0  Afraid - awful might happen 0  Total GAD 7 Score 0    Medications: Outpatient Medications Prior to Visit  Medication Sig   azithromycin  (ZITHROMAX  Z-PAK) 250 MG tablet 2 pills today then 1 pill a day for 4 days   carvedilol  (COREG ) 25 MG tablet Take 1 tablet (25 mg total) by mouth 2 (two) times daily.   cetirizine (ZYRTEC) 10 MG tablet Take 10 mg by mouth daily.   fluticasone  (FLONASE) 50 MCG/ACT nasal  spray SMARTSIG:1-2 Spray(s) Both Nares Daily   hydrALAZINE  (APRESOLINE ) 25 MG tablet Take 1 tablet (25 mg total) by mouth 4 (four) times daily.   ipratropium-albuterol  (DUONEB) 0.5-2.5 (3) MG/3ML SOLN Take 3 mLs by nebulization every 4 (four) hours as needed.   loratadine (CLARITIN) 10 MG tablet 1 tablet Orally Once a day   montelukast (SINGULAIR) 10 MG tablet Take 10 mg by mouth daily.   predniSONE  (STERAPRED UNI-PAK 21 TAB) 10 MG (21) TBPK tablet Take 6 pills on day one then decrease by 1 pill each day   TRELEGY ELLIPTA 200-62.5-25 MCG/ACT AEPB Take 1 puff by mouth daily.   [DISCONTINUED] albuterol  (PROVENTIL ) (2.5 MG/3ML) 0.083% nebulizer solution Take 3 mLs (2.5 mg total) by nebulization every 6 (six) hours as needed for wheezing or shortness of breath.   [DISCONTINUED] albuterol  (VENTOLIN  HFA) 108 (90 Base) MCG/ACT inhaler  INHALE 2 PUFFS BY MOUTH EVERY 6 HOURS AS NEEDED FOR WHEEZING FOR SHORTNESS OF BREATH   No facility-administered medications prior to visit.    Review of Systems  All other systems reviewed and are negative.  All negative Except see HPI       Objective    BP (!) 174/94 (BP Location: Left Arm, Patient Position: Sitting, Cuff Size: Normal) Comment: Manual  Pulse 60   Temp 98.1 F (36.7 C) (Oral)   Ht 5' 9 (1.753 m)   Wt 174 lb (78.9 kg)   SpO2 97%   BMI 25.70 kg/m     Physical Exam Vitals reviewed.  Constitutional:      General: He is not in acute distress.    Appearance: Normal appearance. He is not diaphoretic.  HENT:     Head: Normocephalic and atraumatic.  Eyes:     General: No scleral icterus.    Conjunctiva/sclera: Conjunctivae normal.  Cardiovascular:     Rate and Rhythm: Normal rate and regular rhythm.     Pulses: Normal pulses.     Heart sounds: Normal heart sounds. No murmur heard. Pulmonary:     Effort: Pulmonary effort is normal. No respiratory distress.     Breath sounds: Normal breath sounds. No wheezing or rhonchi.  Musculoskeletal:     Cervical back: Neck supple.     Right lower leg: No edema.     Left lower leg: No edema.  Lymphadenopathy:     Cervical: No cervical adenopathy.  Skin:    General: Skin is warm and dry.     Findings: No rash.  Neurological:     Mental Status: He is alert and oriented to person, place, and time. Mental status is at baseline.  Psychiatric:        Mood and Affect: Mood normal.        Behavior: Behavior normal.      No results found for any visits on 12/28/23.       Assessment & Plan Asthma Asthma managed with Trelegy and albuterol . Trelegy effective. Right-sided congestion present, no dyspnea. Nebulizer solution available but not regularly used. No recent pulmonary function tests. Upcoming allergy specialist appointment scheduled. - Prescribe temporary supply of nebulizer solution, no refills. - Consult  pulmonologist for medication reassessment, including nebulizer need. - Prescribe albuterol  inhaler for emergency use. - Continue Trelegy for maintenance. Advised to rinse mouth after use - Follow up with pulmonologist or allergy specialist for asthma management, including pulmonary function tests and medication adjustments. Will follow-up   Essential hypertension Chronic and unstable Blood pressure elevated due to missed medication and stress. Typically  well-controlled with nighttime medication. History of erectile dysfunction, using Viagra, but unsafe to prescribe currently due to hypertension. Stabilization of blood pressure needed before considering erectile dysfunction medication to avoid risks like myocardial infarction or cerebrovascular accident. - Monitor blood pressure at home, maintain a log, especially morning readings. - Follow-up in 1-2 weeks to reassess blood pressure. - Discuss normalizing blood pressure before prescribing Viagra. - Discuss risks of Viagra with hypertension, including myocardial infarction or cerebrovascular accident. - Consider urology referral for erectile dysfunction management if hypertension persists. Will follow-up  Follow-Up Upcoming allergy specialist appointment crucial for asthma management. Pulmonologist follow-up needed for asthma management reassessment, including nebulizer and medication adjustments. Transportation challenges may affect appointment attendance. - Schedule follow-up in 1-2 weeks to reassess blood pressure and discuss Viagra prescription. - Emphasize importance of allergy specialist appointment for asthma management. - Encourage maintaining a blood pressure log for next appointment. - Highlight importance of regular pulmonologist follow-up for asthma management, including pulmonary function tests and medication adjustments.  Essential hypertension (Primary)  - Comprehensive metabolic panel with GFR - Lipid panel  Asthma,  unspecified asthma severity, unspecified whether complicated, unspecified whether persistent  - albuterol  (PROVENTIL ) (2.5 MG/3ML) 0.083% nebulizer solution; Take 3 mLs (2.5 mg total) by nebulization every 6 (six) hours as needed for wheezing or shortness of breath.  Dispense: 90 mL; Refill: 0 - albuterol  (VENTOLIN  HFA) 108 (90 Base) MCG/ACT inhaler; Inhale 2 puffs into the lungs every 6 (six) hours as needed for wheezing or shortness of breath.  Dispense: 18 g; Refill: 0  Other male erectile dysfunction Chronic In the past used sildenafil 100mg  daily However, due to uncontrolled BP, advised to hold on Rx odf sildenafil Will follow-up  Financial insecurity Pt cannot afford co-pay or transportation.  Orders Placed This Encounter  Procedures   Comprehensive metabolic panel with GFR    Has the patient fasted?:   Yes   Lipid panel    Has the patient fasted?:   Yes    Return in about 2 weeks (around 01/11/2024) for BP f/u.   The patient was advised to call back or seek an in-person evaluation if the symptoms worsen or if the condition fails to improve as anticipated.  I discussed the assessment and treatment plan with the patient. The patient was provided an opportunity to ask questions and all were answered. The patient agreed with the plan and demonstrated an understanding of the instructions.  I, Canden Cieslinski, PA-C have reviewed all documentation for this visit. The documentation on 12/28/2023  for the exam, diagnosis, procedures, and orders are all accurate and complete.  Jolynn Spencer, [facility], MMS [facility] [phone] (phone) [phone] (fax)  [facility]

## 2024-01-01 DIAGNOSIS — N528 Other male erectile dysfunction: Secondary | ICD-10-CM | POA: Insufficient documentation

## 2024-01-01 DIAGNOSIS — Z5986 Financial insecurity: Secondary | ICD-10-CM | POA: Insufficient documentation

## 2024-01-01 DIAGNOSIS — J45909 Unspecified asthma, uncomplicated: Secondary | ICD-10-CM | POA: Insufficient documentation

## 2024-01-18 ENCOUNTER — Other Ambulatory Visit: Payer: Self-pay | Admitting: Physician Assistant

## 2024-01-18 DIAGNOSIS — J45909 Unspecified asthma, uncomplicated: Secondary | ICD-10-CM

## 2024-02-09 ENCOUNTER — Ambulatory Visit: Admitting: Physician Assistant

## 2024-02-17 ENCOUNTER — Other Ambulatory Visit: Payer: Self-pay | Admitting: Physician Assistant

## 2024-02-17 DIAGNOSIS — J45909 Unspecified asthma, uncomplicated: Secondary | ICD-10-CM

## 2024-02-23 ENCOUNTER — Other Ambulatory Visit: Payer: Self-pay | Admitting: Physician Assistant

## 2024-02-26 NOTE — Telephone Encounter (Signed)
 OFFICE VISIT NEEDED FOR ADDITIONAL REFILLS   Requested Prescriptions  Pending Prescriptions Disp Refills   carvedilol  (COREG ) 25 MG tablet [Pharmacy Med Name: Carvedilol  25 MG Oral Tablet] 60 tablet 0    Sig: Take 1 tablet by mouth twice daily     Cardiovascular: Beta Blockers 3 Failed - 02/26/2024  1:47 PM      Failed - Last BP in normal range    BP Readings from Last 1 Encounters:  12/28/23 (!) 174/94         Failed - Valid encounter within last 6 months    Recent Outpatient Visits           2 months ago Essential hypertension   [place] [facility] [place], [place], PA-C              Passed - Cr in normal range and within 360 days    Creatinine, Ser  Date Value Ref Range Status  03/20/2023 0.81 0.61 - 1.24 mg/dL Final         Passed - AST in normal range and within 360 days    AST  Date Value Ref Range Status  03/20/2023 21 15 - 41 U/L Final         Passed - ALT in normal range and within 360 days    ALT  Date Value Ref Range Status  03/20/2023 21 0 - 44 U/L Final         Passed - Last Heart Rate in normal range    Pulse Readings from Last 1 Encounters:  12/28/23 60

## 2024-03-02 ENCOUNTER — Other Ambulatory Visit: Payer: Self-pay | Admitting: Physician Assistant

## 2024-03-02 DIAGNOSIS — J45909 Unspecified asthma, uncomplicated: Secondary | ICD-10-CM

## 2024-03-12 ENCOUNTER — Ambulatory Visit: Admitting: Internal Medicine

## 2024-04-01 ENCOUNTER — Other Ambulatory Visit: Payer: Self-pay | Admitting: Physician Assistant

## 2024-04-01 DIAGNOSIS — J45909 Unspecified asthma, uncomplicated: Secondary | ICD-10-CM

## 2024-05-10 ENCOUNTER — Other Ambulatory Visit: Payer: Self-pay | Admitting: Physician Assistant

## 2024-05-10 DIAGNOSIS — I10 Essential (primary) hypertension: Secondary | ICD-10-CM

## 2024-05-23 ENCOUNTER — Ambulatory Visit: Admitting: Internal Medicine

## 2024-06-05 ENCOUNTER — Other Ambulatory Visit: Payer: Self-pay | Admitting: Physician Assistant

## 2024-06-05 DIAGNOSIS — J45909 Unspecified asthma, uncomplicated: Secondary | ICD-10-CM

## 2024-07-17 ENCOUNTER — Ambulatory Visit: Admitting: Physician Assistant
